# Patient Record
Sex: Female | Born: 1940 | Smoking: Never smoker
Health system: Southern US, Community
[De-identification: ages and names within clinical notes are randomized; demographics above are authoritative.]

## PROBLEM LIST (undated history)

## (undated) DIAGNOSIS — G459 Transient cerebral ischemic attack, unspecified: Secondary | ICD-10-CM

## (undated) DIAGNOSIS — I10 Essential (primary) hypertension: Secondary | ICD-10-CM

## (undated) DIAGNOSIS — J449 Chronic obstructive pulmonary disease, unspecified: Secondary | ICD-10-CM

## (undated) DIAGNOSIS — E119 Type 2 diabetes mellitus without complications: Secondary | ICD-10-CM

## (undated) DIAGNOSIS — F419 Anxiety disorder, unspecified: Secondary | ICD-10-CM

## (undated) DIAGNOSIS — E785 Hyperlipidemia, unspecified: Secondary | ICD-10-CM

## (undated) DIAGNOSIS — I639 Cerebral infarction, unspecified: Secondary | ICD-10-CM

## (undated) DIAGNOSIS — R0600 Dyspnea, unspecified: Secondary | ICD-10-CM

## (undated) HISTORY — PX: HERNIA REPAIR: SHX51

## (undated) HISTORY — DX: Cerebral infarction, unspecified: I63.9

## (undated) HISTORY — DX: Type 2 diabetes mellitus without complications: E11.9

## (undated) HISTORY — DX: Essential (primary) hypertension: I10

## (undated) HISTORY — PX: TOTAL ABDOMINAL HYSTERECTOMY: SHX209

## (undated) HISTORY — PX: CATARACT EXTRACTION: SUR2

## (undated) HISTORY — DX: Hyperlipidemia, unspecified: E78.5

## (undated) HISTORY — DX: Chronic obstructive pulmonary disease, unspecified: J44.9

## (undated) HISTORY — DX: Anxiety disorder, unspecified: F41.9

## (undated) HISTORY — DX: Transient cerebral ischemic attack, unspecified: G45.9

## (undated) HISTORY — DX: Dyspnea, unspecified: R06.00

---

## 1971-10-14 HISTORY — PX: TUBAL LIGATION: SHX77

## 1986-10-13 HISTORY — PX: VESICOVAGINAL FISTULA CLOSURE W/ TAH: SUR271

## 2011-11-20 DIAGNOSIS — N390 Urinary tract infection, site not specified: Secondary | ICD-10-CM | POA: Diagnosis not present

## 2011-12-18 DIAGNOSIS — N390 Urinary tract infection, site not specified: Secondary | ICD-10-CM | POA: Diagnosis not present

## 2011-12-18 DIAGNOSIS — N3289 Other specified disorders of bladder: Secondary | ICD-10-CM | POA: Diagnosis not present

## 2011-12-30 DIAGNOSIS — K219 Gastro-esophageal reflux disease without esophagitis: Secondary | ICD-10-CM | POA: Diagnosis not present

## 2011-12-30 DIAGNOSIS — F329 Major depressive disorder, single episode, unspecified: Secondary | ICD-10-CM | POA: Diagnosis not present

## 2011-12-30 DIAGNOSIS — E119 Type 2 diabetes mellitus without complications: Secondary | ICD-10-CM | POA: Diagnosis not present

## 2011-12-30 DIAGNOSIS — I10 Essential (primary) hypertension: Secondary | ICD-10-CM | POA: Diagnosis not present

## 2011-12-30 DIAGNOSIS — Z79899 Other long term (current) drug therapy: Secondary | ICD-10-CM | POA: Diagnosis not present

## 2011-12-30 DIAGNOSIS — E785 Hyperlipidemia, unspecified: Secondary | ICD-10-CM | POA: Diagnosis not present

## 2012-02-09 DIAGNOSIS — I10 Essential (primary) hypertension: Secondary | ICD-10-CM | POA: Diagnosis not present

## 2012-02-09 DIAGNOSIS — L723 Sebaceous cyst: Secondary | ICD-10-CM | POA: Diagnosis not present

## 2012-02-11 DIAGNOSIS — L02219 Cutaneous abscess of trunk, unspecified: Secondary | ICD-10-CM | POA: Diagnosis not present

## 2012-02-11 DIAGNOSIS — L723 Sebaceous cyst: Secondary | ICD-10-CM | POA: Diagnosis not present

## 2012-02-11 DIAGNOSIS — L03319 Cellulitis of trunk, unspecified: Secondary | ICD-10-CM | POA: Diagnosis not present

## 2012-02-12 DIAGNOSIS — T8189XA Other complications of procedures, not elsewhere classified, initial encounter: Secondary | ICD-10-CM | POA: Diagnosis not present

## 2012-03-31 DIAGNOSIS — L723 Sebaceous cyst: Secondary | ICD-10-CM | POA: Diagnosis not present

## 2012-04-07 DIAGNOSIS — K5900 Constipation, unspecified: Secondary | ICD-10-CM | POA: Diagnosis not present

## 2012-04-07 DIAGNOSIS — K602 Anal fissure, unspecified: Secondary | ICD-10-CM | POA: Diagnosis not present

## 2012-05-04 DIAGNOSIS — M1710 Unilateral primary osteoarthritis, unspecified knee: Secondary | ICD-10-CM | POA: Diagnosis not present

## 2012-05-04 DIAGNOSIS — M25569 Pain in unspecified knee: Secondary | ICD-10-CM | POA: Diagnosis not present

## 2012-05-11 DIAGNOSIS — Z79899 Other long term (current) drug therapy: Secondary | ICD-10-CM | POA: Diagnosis not present

## 2012-05-11 DIAGNOSIS — I10 Essential (primary) hypertension: Secondary | ICD-10-CM | POA: Diagnosis not present

## 2012-05-11 DIAGNOSIS — K219 Gastro-esophageal reflux disease without esophagitis: Secondary | ICD-10-CM | POA: Diagnosis not present

## 2012-05-11 DIAGNOSIS — E785 Hyperlipidemia, unspecified: Secondary | ICD-10-CM | POA: Diagnosis not present

## 2012-05-11 DIAGNOSIS — E119 Type 2 diabetes mellitus without complications: Secondary | ICD-10-CM | POA: Diagnosis not present

## 2012-06-25 DIAGNOSIS — N390 Urinary tract infection, site not specified: Secondary | ICD-10-CM | POA: Diagnosis not present

## 2012-06-25 DIAGNOSIS — F411 Generalized anxiety disorder: Secondary | ICD-10-CM | POA: Diagnosis not present

## 2012-07-05 DIAGNOSIS — N3289 Other specified disorders of bladder: Secondary | ICD-10-CM | POA: Diagnosis not present

## 2012-07-05 DIAGNOSIS — N390 Urinary tract infection, site not specified: Secondary | ICD-10-CM | POA: Diagnosis not present

## 2012-07-05 DIAGNOSIS — R300 Dysuria: Secondary | ICD-10-CM | POA: Diagnosis not present

## 2012-07-08 DIAGNOSIS — G5710 Meralgia paresthetica, unspecified lower limb: Secondary | ICD-10-CM | POA: Diagnosis not present

## 2012-07-08 DIAGNOSIS — M7150 Other bursitis, not elsewhere classified, unspecified site: Secondary | ICD-10-CM | POA: Diagnosis not present

## 2012-07-22 DIAGNOSIS — B029 Zoster without complications: Secondary | ICD-10-CM | POA: Diagnosis not present

## 2012-07-26 DIAGNOSIS — N318 Other neuromuscular dysfunction of bladder: Secondary | ICD-10-CM | POA: Diagnosis not present

## 2012-07-26 DIAGNOSIS — R300 Dysuria: Secondary | ICD-10-CM | POA: Diagnosis not present

## 2012-07-26 DIAGNOSIS — N390 Urinary tract infection, site not specified: Secondary | ICD-10-CM | POA: Diagnosis not present

## 2012-07-26 DIAGNOSIS — N952 Postmenopausal atrophic vaginitis: Secondary | ICD-10-CM | POA: Diagnosis not present

## 2012-08-25 DIAGNOSIS — E119 Type 2 diabetes mellitus without complications: Secondary | ICD-10-CM | POA: Diagnosis not present

## 2012-08-25 DIAGNOSIS — E785 Hyperlipidemia, unspecified: Secondary | ICD-10-CM | POA: Diagnosis not present

## 2012-08-25 DIAGNOSIS — Z79899 Other long term (current) drug therapy: Secondary | ICD-10-CM | POA: Diagnosis not present

## 2012-08-25 DIAGNOSIS — I10 Essential (primary) hypertension: Secondary | ICD-10-CM | POA: Diagnosis not present

## 2012-08-25 DIAGNOSIS — F329 Major depressive disorder, single episode, unspecified: Secondary | ICD-10-CM | POA: Diagnosis not present

## 2012-08-25 DIAGNOSIS — G4700 Insomnia, unspecified: Secondary | ICD-10-CM | POA: Diagnosis not present

## 2012-08-25 DIAGNOSIS — K219 Gastro-esophageal reflux disease without esophagitis: Secondary | ICD-10-CM | POA: Diagnosis not present

## 2013-02-10 DIAGNOSIS — I10 Essential (primary) hypertension: Secondary | ICD-10-CM | POA: Diagnosis not present

## 2013-02-10 DIAGNOSIS — K219 Gastro-esophageal reflux disease without esophagitis: Secondary | ICD-10-CM | POA: Diagnosis not present

## 2013-02-10 DIAGNOSIS — E119 Type 2 diabetes mellitus without complications: Secondary | ICD-10-CM | POA: Diagnosis not present

## 2013-02-10 DIAGNOSIS — G4700 Insomnia, unspecified: Secondary | ICD-10-CM | POA: Diagnosis not present

## 2013-02-28 DIAGNOSIS — I10 Essential (primary) hypertension: Secondary | ICD-10-CM | POA: Diagnosis not present

## 2013-02-28 DIAGNOSIS — E7800 Pure hypercholesterolemia, unspecified: Secondary | ICD-10-CM | POA: Diagnosis not present

## 2013-02-28 DIAGNOSIS — E119 Type 2 diabetes mellitus without complications: Secondary | ICD-10-CM | POA: Diagnosis not present

## 2013-03-10 DIAGNOSIS — E785 Hyperlipidemia, unspecified: Secondary | ICD-10-CM | POA: Diagnosis not present

## 2013-03-10 DIAGNOSIS — Z0000 Encounter for general adult medical examination without abnormal findings: Secondary | ICD-10-CM | POA: Diagnosis not present

## 2013-03-10 DIAGNOSIS — I10 Essential (primary) hypertension: Secondary | ICD-10-CM | POA: Diagnosis not present

## 2013-03-10 DIAGNOSIS — E876 Hypokalemia: Secondary | ICD-10-CM | POA: Diagnosis not present

## 2013-03-10 DIAGNOSIS — E119 Type 2 diabetes mellitus without complications: Secondary | ICD-10-CM | POA: Diagnosis not present

## 2013-03-10 DIAGNOSIS — K219 Gastro-esophageal reflux disease without esophagitis: Secondary | ICD-10-CM | POA: Diagnosis not present

## 2013-03-11 DIAGNOSIS — G4700 Insomnia, unspecified: Secondary | ICD-10-CM | POA: Diagnosis not present

## 2013-03-11 DIAGNOSIS — I10 Essential (primary) hypertension: Secondary | ICD-10-CM | POA: Diagnosis not present

## 2013-03-11 DIAGNOSIS — Z006 Encounter for examination for normal comparison and control in clinical research program: Secondary | ICD-10-CM | POA: Diagnosis not present

## 2013-03-11 DIAGNOSIS — F329 Major depressive disorder, single episode, unspecified: Secondary | ICD-10-CM | POA: Diagnosis not present

## 2013-04-25 DIAGNOSIS — N3000 Acute cystitis without hematuria: Secondary | ICD-10-CM | POA: Diagnosis not present

## 2013-06-20 DIAGNOSIS — I10 Essential (primary) hypertension: Secondary | ICD-10-CM | POA: Diagnosis not present

## 2013-06-20 DIAGNOSIS — R21 Rash and other nonspecific skin eruption: Secondary | ICD-10-CM | POA: Diagnosis not present

## 2013-06-20 DIAGNOSIS — G4700 Insomnia, unspecified: Secondary | ICD-10-CM | POA: Diagnosis not present

## 2013-06-20 DIAGNOSIS — E119 Type 2 diabetes mellitus without complications: Secondary | ICD-10-CM | POA: Diagnosis not present

## 2013-06-24 DIAGNOSIS — L738 Other specified follicular disorders: Secondary | ICD-10-CM | POA: Diagnosis not present

## 2013-06-24 DIAGNOSIS — L981 Factitial dermatitis: Secondary | ICD-10-CM | POA: Diagnosis not present

## 2013-06-24 DIAGNOSIS — L259 Unspecified contact dermatitis, unspecified cause: Secondary | ICD-10-CM | POA: Diagnosis not present

## 2013-09-16 DIAGNOSIS — E119 Type 2 diabetes mellitus without complications: Secondary | ICD-10-CM | POA: Diagnosis not present

## 2013-09-16 DIAGNOSIS — Z23 Encounter for immunization: Secondary | ICD-10-CM | POA: Diagnosis not present

## 2013-09-16 DIAGNOSIS — Z79899 Other long term (current) drug therapy: Secondary | ICD-10-CM | POA: Diagnosis not present

## 2013-09-16 DIAGNOSIS — I10 Essential (primary) hypertension: Secondary | ICD-10-CM | POA: Diagnosis not present

## 2013-09-16 DIAGNOSIS — E785 Hyperlipidemia, unspecified: Secondary | ICD-10-CM | POA: Diagnosis not present

## 2013-10-13 DIAGNOSIS — G459 Transient cerebral ischemic attack, unspecified: Secondary | ICD-10-CM

## 2013-10-13 DIAGNOSIS — I639 Cerebral infarction, unspecified: Secondary | ICD-10-CM

## 2013-10-13 HISTORY — DX: Transient cerebral ischemic attack, unspecified: G45.9

## 2013-10-13 HISTORY — DX: Cerebral infarction, unspecified: I63.9

## 2013-10-28 DIAGNOSIS — R0989 Other specified symptoms and signs involving the circulatory and respiratory systems: Secondary | ICD-10-CM | POA: Diagnosis not present

## 2013-10-28 DIAGNOSIS — N3090 Cystitis, unspecified without hematuria: Secondary | ICD-10-CM | POA: Diagnosis not present

## 2013-10-28 DIAGNOSIS — R0609 Other forms of dyspnea: Secondary | ICD-10-CM | POA: Diagnosis not present

## 2013-10-29 DIAGNOSIS — R209 Unspecified disturbances of skin sensation: Secondary | ICD-10-CM | POA: Diagnosis not present

## 2013-10-29 DIAGNOSIS — R0602 Shortness of breath: Secondary | ICD-10-CM | POA: Diagnosis not present

## 2013-10-29 DIAGNOSIS — S0990XA Unspecified injury of head, initial encounter: Secondary | ICD-10-CM | POA: Diagnosis not present

## 2013-10-29 DIAGNOSIS — J90 Pleural effusion, not elsewhere classified: Secondary | ICD-10-CM | POA: Diagnosis not present

## 2013-10-29 DIAGNOSIS — R072 Precordial pain: Secondary | ICD-10-CM | POA: Diagnosis not present

## 2013-10-29 DIAGNOSIS — I6789 Other cerebrovascular disease: Secondary | ICD-10-CM | POA: Diagnosis not present

## 2013-10-30 DIAGNOSIS — R52 Pain, unspecified: Secondary | ICD-10-CM | POA: Diagnosis present

## 2013-10-30 DIAGNOSIS — R5381 Other malaise: Secondary | ICD-10-CM | POA: Diagnosis present

## 2013-10-30 DIAGNOSIS — R0989 Other specified symptoms and signs involving the circulatory and respiratory systems: Secondary | ICD-10-CM | POA: Diagnosis present

## 2013-10-30 DIAGNOSIS — E860 Dehydration: Secondary | ICD-10-CM | POA: Diagnosis present

## 2013-10-30 DIAGNOSIS — G459 Transient cerebral ischemic attack, unspecified: Secondary | ICD-10-CM | POA: Diagnosis present

## 2013-10-30 DIAGNOSIS — I369 Nonrheumatic tricuspid valve disorder, unspecified: Secondary | ICD-10-CM | POA: Diagnosis not present

## 2013-10-30 DIAGNOSIS — E119 Type 2 diabetes mellitus without complications: Secondary | ICD-10-CM | POA: Diagnosis present

## 2013-10-30 DIAGNOSIS — J449 Chronic obstructive pulmonary disease, unspecified: Secondary | ICD-10-CM | POA: Diagnosis present

## 2013-10-30 DIAGNOSIS — E785 Hyperlipidemia, unspecified: Secondary | ICD-10-CM | POA: Diagnosis present

## 2013-10-30 DIAGNOSIS — R0789 Other chest pain: Secondary | ICD-10-CM | POA: Diagnosis not present

## 2013-10-30 DIAGNOSIS — Z79899 Other long term (current) drug therapy: Secondary | ICD-10-CM | POA: Diagnosis not present

## 2013-10-30 DIAGNOSIS — I359 Nonrheumatic aortic valve disorder, unspecified: Secondary | ICD-10-CM | POA: Diagnosis not present

## 2013-10-30 DIAGNOSIS — R079 Chest pain, unspecified: Secondary | ICD-10-CM | POA: Diagnosis not present

## 2013-10-30 DIAGNOSIS — I10 Essential (primary) hypertension: Secondary | ICD-10-CM | POA: Diagnosis present

## 2013-10-30 DIAGNOSIS — R29810 Facial weakness: Secondary | ICD-10-CM | POA: Diagnosis not present

## 2013-10-30 DIAGNOSIS — R0609 Other forms of dyspnea: Secondary | ICD-10-CM | POA: Diagnosis present

## 2013-10-30 DIAGNOSIS — E669 Obesity, unspecified: Secondary | ICD-10-CM | POA: Diagnosis present

## 2013-10-30 DIAGNOSIS — I658 Occlusion and stenosis of other precerebral arteries: Secondary | ICD-10-CM | POA: Diagnosis not present

## 2013-10-30 DIAGNOSIS — Z0389 Encounter for observation for other suspected diseases and conditions ruled out: Secondary | ICD-10-CM | POA: Diagnosis not present

## 2013-10-31 DIAGNOSIS — R29810 Facial weakness: Secondary | ICD-10-CM | POA: Diagnosis not present

## 2013-10-31 DIAGNOSIS — I359 Nonrheumatic aortic valve disorder, unspecified: Secondary | ICD-10-CM | POA: Diagnosis not present

## 2013-10-31 DIAGNOSIS — I369 Nonrheumatic tricuspid valve disorder, unspecified: Secondary | ICD-10-CM | POA: Diagnosis not present

## 2013-11-03 DIAGNOSIS — I10 Essential (primary) hypertension: Secondary | ICD-10-CM | POA: Diagnosis not present

## 2013-11-03 DIAGNOSIS — R0789 Other chest pain: Secondary | ICD-10-CM | POA: Diagnosis not present

## 2013-11-03 DIAGNOSIS — G459 Transient cerebral ischemic attack, unspecified: Secondary | ICD-10-CM | POA: Diagnosis not present

## 2013-11-03 DIAGNOSIS — R079 Chest pain, unspecified: Secondary | ICD-10-CM | POA: Diagnosis not present

## 2013-11-03 DIAGNOSIS — J449 Chronic obstructive pulmonary disease, unspecified: Secondary | ICD-10-CM | POA: Diagnosis not present

## 2013-11-03 DIAGNOSIS — E119 Type 2 diabetes mellitus without complications: Secondary | ICD-10-CM | POA: Diagnosis not present

## 2013-11-10 DIAGNOSIS — Z79899 Other long term (current) drug therapy: Secondary | ICD-10-CM | POA: Diagnosis not present

## 2013-11-10 DIAGNOSIS — F411 Generalized anxiety disorder: Secondary | ICD-10-CM | POA: Diagnosis not present

## 2013-11-10 DIAGNOSIS — J449 Chronic obstructive pulmonary disease, unspecified: Secondary | ICD-10-CM | POA: Diagnosis not present

## 2013-11-10 DIAGNOSIS — R5381 Other malaise: Secondary | ICD-10-CM | POA: Diagnosis not present

## 2013-11-10 DIAGNOSIS — R5383 Other fatigue: Secondary | ICD-10-CM | POA: Diagnosis not present

## 2013-11-10 DIAGNOSIS — I10 Essential (primary) hypertension: Secondary | ICD-10-CM | POA: Diagnosis not present

## 2013-11-11 ENCOUNTER — Institutional Professional Consult (permissible substitution): Payer: Self-pay | Admitting: Pulmonary Disease

## 2013-11-22 ENCOUNTER — Institutional Professional Consult (permissible substitution): Payer: Self-pay | Admitting: Critical Care Medicine

## 2013-11-28 ENCOUNTER — Institutional Professional Consult (permissible substitution): Payer: Self-pay | Admitting: Critical Care Medicine

## 2013-12-02 ENCOUNTER — Institutional Professional Consult (permissible substitution): Payer: Self-pay | Admitting: Critical Care Medicine

## 2013-12-09 DIAGNOSIS — E119 Type 2 diabetes mellitus without complications: Secondary | ICD-10-CM | POA: Diagnosis not present

## 2013-12-09 DIAGNOSIS — H5200 Hypermetropia, unspecified eye: Secondary | ICD-10-CM | POA: Diagnosis not present

## 2013-12-15 ENCOUNTER — Institutional Professional Consult (permissible substitution): Payer: Self-pay | Admitting: Critical Care Medicine

## 2013-12-16 DIAGNOSIS — F411 Generalized anxiety disorder: Secondary | ICD-10-CM | POA: Diagnosis not present

## 2013-12-16 DIAGNOSIS — E119 Type 2 diabetes mellitus without complications: Secondary | ICD-10-CM | POA: Diagnosis not present

## 2013-12-16 DIAGNOSIS — I10 Essential (primary) hypertension: Secondary | ICD-10-CM | POA: Diagnosis not present

## 2013-12-16 DIAGNOSIS — J449 Chronic obstructive pulmonary disease, unspecified: Secondary | ICD-10-CM | POA: Diagnosis not present

## 2013-12-21 ENCOUNTER — Encounter: Payer: Self-pay | Admitting: Critical Care Medicine

## 2013-12-22 ENCOUNTER — Institutional Professional Consult (permissible substitution): Payer: Self-pay | Admitting: Critical Care Medicine

## 2014-01-17 ENCOUNTER — Encounter: Payer: Self-pay | Admitting: *Deleted

## 2014-01-18 ENCOUNTER — Institutional Professional Consult (permissible substitution): Payer: Self-pay | Admitting: Critical Care Medicine

## 2014-04-11 DIAGNOSIS — L981 Factitial dermatitis: Secondary | ICD-10-CM | POA: Diagnosis not present

## 2014-04-11 DIAGNOSIS — L259 Unspecified contact dermatitis, unspecified cause: Secondary | ICD-10-CM | POA: Diagnosis not present

## 2014-04-12 DIAGNOSIS — Z23 Encounter for immunization: Secondary | ICD-10-CM | POA: Diagnosis not present

## 2014-04-19 DIAGNOSIS — I10 Essential (primary) hypertension: Secondary | ICD-10-CM | POA: Diagnosis not present

## 2014-04-19 DIAGNOSIS — E782 Mixed hyperlipidemia: Secondary | ICD-10-CM | POA: Diagnosis not present

## 2014-04-19 DIAGNOSIS — K219 Gastro-esophageal reflux disease without esophagitis: Secondary | ICD-10-CM | POA: Diagnosis not present

## 2014-04-19 DIAGNOSIS — E119 Type 2 diabetes mellitus without complications: Secondary | ICD-10-CM | POA: Diagnosis not present

## 2014-04-26 DIAGNOSIS — Z1231 Encounter for screening mammogram for malignant neoplasm of breast: Secondary | ICD-10-CM | POA: Diagnosis not present

## 2014-04-26 DIAGNOSIS — E782 Mixed hyperlipidemia: Secondary | ICD-10-CM | POA: Diagnosis not present

## 2014-04-26 DIAGNOSIS — I10 Essential (primary) hypertension: Secondary | ICD-10-CM | POA: Diagnosis not present

## 2014-04-26 DIAGNOSIS — E119 Type 2 diabetes mellitus without complications: Secondary | ICD-10-CM | POA: Diagnosis not present

## 2014-04-30 DIAGNOSIS — J209 Acute bronchitis, unspecified: Secondary | ICD-10-CM | POA: Diagnosis not present

## 2014-04-30 DIAGNOSIS — E119 Type 2 diabetes mellitus without complications: Secondary | ICD-10-CM | POA: Diagnosis not present

## 2014-04-30 DIAGNOSIS — I10 Essential (primary) hypertension: Secondary | ICD-10-CM | POA: Diagnosis not present

## 2014-04-30 DIAGNOSIS — Z7982 Long term (current) use of aspirin: Secondary | ICD-10-CM | POA: Diagnosis not present

## 2014-04-30 DIAGNOSIS — Z8673 Personal history of transient ischemic attack (TIA), and cerebral infarction without residual deficits: Secondary | ICD-10-CM | POA: Diagnosis not present

## 2014-04-30 DIAGNOSIS — F329 Major depressive disorder, single episode, unspecified: Secondary | ICD-10-CM | POA: Diagnosis not present

## 2014-04-30 DIAGNOSIS — M129 Arthropathy, unspecified: Secondary | ICD-10-CM | POA: Diagnosis not present

## 2014-04-30 DIAGNOSIS — F3289 Other specified depressive episodes: Secondary | ICD-10-CM | POA: Diagnosis not present

## 2014-04-30 DIAGNOSIS — E7800 Pure hypercholesterolemia, unspecified: Secondary | ICD-10-CM | POA: Diagnosis not present

## 2014-04-30 DIAGNOSIS — R0602 Shortness of breath: Secondary | ICD-10-CM | POA: Diagnosis not present

## 2014-04-30 DIAGNOSIS — Z79899 Other long term (current) drug therapy: Secondary | ICD-10-CM | POA: Diagnosis not present

## 2014-04-30 DIAGNOSIS — J440 Chronic obstructive pulmonary disease with acute lower respiratory infection: Secondary | ICD-10-CM | POA: Diagnosis not present

## 2014-05-08 DIAGNOSIS — Z6837 Body mass index (BMI) 37.0-37.9, adult: Secondary | ICD-10-CM | POA: Diagnosis not present

## 2014-05-08 DIAGNOSIS — M549 Dorsalgia, unspecified: Secondary | ICD-10-CM | POA: Diagnosis not present

## 2014-05-20 DIAGNOSIS — M5430 Sciatica, unspecified side: Secondary | ICD-10-CM | POA: Diagnosis not present

## 2014-05-20 DIAGNOSIS — M79609 Pain in unspecified limb: Secondary | ICD-10-CM | POA: Diagnosis not present

## 2014-05-20 DIAGNOSIS — N390 Urinary tract infection, site not specified: Secondary | ICD-10-CM | POA: Diagnosis not present

## 2014-05-20 DIAGNOSIS — I10 Essential (primary) hypertension: Secondary | ICD-10-CM | POA: Diagnosis not present

## 2014-05-20 DIAGNOSIS — E7800 Pure hypercholesterolemia, unspecified: Secondary | ICD-10-CM | POA: Diagnosis not present

## 2014-05-20 DIAGNOSIS — J449 Chronic obstructive pulmonary disease, unspecified: Secondary | ICD-10-CM | POA: Diagnosis not present

## 2014-05-20 DIAGNOSIS — R0989 Other specified symptoms and signs involving the circulatory and respiratory systems: Secondary | ICD-10-CM | POA: Diagnosis not present

## 2014-05-20 DIAGNOSIS — Z79899 Other long term (current) drug therapy: Secondary | ICD-10-CM | POA: Diagnosis not present

## 2014-05-20 DIAGNOSIS — E119 Type 2 diabetes mellitus without complications: Secondary | ICD-10-CM | POA: Diagnosis not present

## 2014-05-24 DIAGNOSIS — IMO0002 Reserved for concepts with insufficient information to code with codable children: Secondary | ICD-10-CM | POA: Diagnosis not present

## 2014-05-24 DIAGNOSIS — M549 Dorsalgia, unspecified: Secondary | ICD-10-CM | POA: Diagnosis not present

## 2014-05-24 DIAGNOSIS — Z6837 Body mass index (BMI) 37.0-37.9, adult: Secondary | ICD-10-CM | POA: Diagnosis not present

## 2014-05-24 DIAGNOSIS — M47817 Spondylosis without myelopathy or radiculopathy, lumbosacral region: Secondary | ICD-10-CM | POA: Diagnosis not present

## 2014-05-29 DIAGNOSIS — M1710 Unilateral primary osteoarthritis, unspecified knee: Secondary | ICD-10-CM | POA: Diagnosis not present

## 2014-05-31 DIAGNOSIS — IMO0001 Reserved for inherently not codable concepts without codable children: Secondary | ICD-10-CM | POA: Diagnosis not present

## 2014-05-31 DIAGNOSIS — E119 Type 2 diabetes mellitus without complications: Secondary | ICD-10-CM | POA: Diagnosis not present

## 2014-05-31 DIAGNOSIS — M545 Low back pain, unspecified: Secondary | ICD-10-CM | POA: Diagnosis not present

## 2014-05-31 DIAGNOSIS — R269 Unspecified abnormalities of gait and mobility: Secondary | ICD-10-CM | POA: Diagnosis not present

## 2014-06-02 DIAGNOSIS — M545 Low back pain, unspecified: Secondary | ICD-10-CM | POA: Diagnosis not present

## 2014-06-02 DIAGNOSIS — E119 Type 2 diabetes mellitus without complications: Secondary | ICD-10-CM | POA: Diagnosis not present

## 2014-06-02 DIAGNOSIS — R269 Unspecified abnormalities of gait and mobility: Secondary | ICD-10-CM | POA: Diagnosis not present

## 2014-06-02 DIAGNOSIS — IMO0001 Reserved for inherently not codable concepts without codable children: Secondary | ICD-10-CM | POA: Diagnosis not present

## 2014-06-05 DIAGNOSIS — E119 Type 2 diabetes mellitus without complications: Secondary | ICD-10-CM | POA: Diagnosis not present

## 2014-06-05 DIAGNOSIS — IMO0001 Reserved for inherently not codable concepts without codable children: Secondary | ICD-10-CM | POA: Diagnosis not present

## 2014-06-05 DIAGNOSIS — R269 Unspecified abnormalities of gait and mobility: Secondary | ICD-10-CM | POA: Diagnosis not present

## 2014-06-05 DIAGNOSIS — M545 Low back pain, unspecified: Secondary | ICD-10-CM | POA: Diagnosis not present

## 2014-06-06 DIAGNOSIS — M48061 Spinal stenosis, lumbar region without neurogenic claudication: Secondary | ICD-10-CM | POA: Diagnosis not present

## 2014-06-07 DIAGNOSIS — E119 Type 2 diabetes mellitus without complications: Secondary | ICD-10-CM | POA: Diagnosis not present

## 2014-06-07 DIAGNOSIS — M545 Low back pain, unspecified: Secondary | ICD-10-CM | POA: Diagnosis not present

## 2014-06-07 DIAGNOSIS — IMO0001 Reserved for inherently not codable concepts without codable children: Secondary | ICD-10-CM | POA: Diagnosis not present

## 2014-06-07 DIAGNOSIS — R269 Unspecified abnormalities of gait and mobility: Secondary | ICD-10-CM | POA: Diagnosis not present

## 2014-08-30 DIAGNOSIS — E782 Mixed hyperlipidemia: Secondary | ICD-10-CM | POA: Diagnosis not present

## 2014-08-30 DIAGNOSIS — I10 Essential (primary) hypertension: Secondary | ICD-10-CM | POA: Diagnosis not present

## 2014-08-30 DIAGNOSIS — E119 Type 2 diabetes mellitus without complications: Secondary | ICD-10-CM | POA: Diagnosis not present

## 2014-09-06 DIAGNOSIS — J449 Chronic obstructive pulmonary disease, unspecified: Secondary | ICD-10-CM | POA: Diagnosis not present

## 2014-09-06 DIAGNOSIS — I10 Essential (primary) hypertension: Secondary | ICD-10-CM | POA: Diagnosis not present

## 2014-09-06 DIAGNOSIS — E782 Mixed hyperlipidemia: Secondary | ICD-10-CM | POA: Diagnosis not present

## 2014-09-06 DIAGNOSIS — E119 Type 2 diabetes mellitus without complications: Secondary | ICD-10-CM | POA: Diagnosis not present

## 2014-10-22 DIAGNOSIS — I10 Essential (primary) hypertension: Secondary | ICD-10-CM | POA: Diagnosis not present

## 2014-10-22 DIAGNOSIS — E780 Pure hypercholesterolemia: Secondary | ICD-10-CM | POA: Diagnosis not present

## 2014-10-22 DIAGNOSIS — F329 Major depressive disorder, single episode, unspecified: Secondary | ICD-10-CM | POA: Diagnosis not present

## 2014-10-22 DIAGNOSIS — J209 Acute bronchitis, unspecified: Secondary | ICD-10-CM | POA: Diagnosis not present

## 2014-10-22 DIAGNOSIS — M1990 Unspecified osteoarthritis, unspecified site: Secondary | ICD-10-CM | POA: Diagnosis not present

## 2014-10-22 DIAGNOSIS — E119 Type 2 diabetes mellitus without complications: Secondary | ICD-10-CM | POA: Diagnosis not present

## 2014-10-22 DIAGNOSIS — R05 Cough: Secondary | ICD-10-CM | POA: Diagnosis not present

## 2014-10-22 DIAGNOSIS — Z792 Long term (current) use of antibiotics: Secondary | ICD-10-CM | POA: Diagnosis not present

## 2014-10-22 DIAGNOSIS — Z7982 Long term (current) use of aspirin: Secondary | ICD-10-CM | POA: Diagnosis not present

## 2014-10-27 DIAGNOSIS — Z792 Long term (current) use of antibiotics: Secondary | ICD-10-CM | POA: Diagnosis not present

## 2014-10-27 DIAGNOSIS — J449 Chronic obstructive pulmonary disease, unspecified: Secondary | ICD-10-CM | POA: Diagnosis not present

## 2014-10-27 DIAGNOSIS — J069 Acute upper respiratory infection, unspecified: Secondary | ICD-10-CM | POA: Diagnosis not present

## 2014-10-27 DIAGNOSIS — R05 Cough: Secondary | ICD-10-CM | POA: Diagnosis not present

## 2014-10-27 DIAGNOSIS — Z7982 Long term (current) use of aspirin: Secondary | ICD-10-CM | POA: Diagnosis not present

## 2014-10-27 DIAGNOSIS — R0602 Shortness of breath: Secondary | ICD-10-CM | POA: Diagnosis not present

## 2014-10-27 DIAGNOSIS — I10 Essential (primary) hypertension: Secondary | ICD-10-CM | POA: Diagnosis not present

## 2014-10-28 DIAGNOSIS — R069 Unspecified abnormalities of breathing: Secondary | ICD-10-CM | POA: Diagnosis not present

## 2014-10-28 DIAGNOSIS — R0602 Shortness of breath: Secondary | ICD-10-CM | POA: Diagnosis not present

## 2014-10-28 DIAGNOSIS — E785 Hyperlipidemia, unspecified: Secondary | ICD-10-CM | POA: Diagnosis not present

## 2014-10-28 DIAGNOSIS — K8080 Other cholelithiasis without obstruction: Secondary | ICD-10-CM | POA: Diagnosis not present

## 2014-10-28 DIAGNOSIS — Z79899 Other long term (current) drug therapy: Secondary | ICD-10-CM | POA: Diagnosis not present

## 2014-10-28 DIAGNOSIS — E119 Type 2 diabetes mellitus without complications: Secondary | ICD-10-CM | POA: Diagnosis not present

## 2014-10-28 DIAGNOSIS — J069 Acute upper respiratory infection, unspecified: Secondary | ICD-10-CM | POA: Diagnosis not present

## 2014-10-28 DIAGNOSIS — Z6839 Body mass index (BMI) 39.0-39.9, adult: Secondary | ICD-10-CM | POA: Diagnosis not present

## 2014-10-28 DIAGNOSIS — R079 Chest pain, unspecified: Secondary | ICD-10-CM | POA: Diagnosis not present

## 2014-10-28 DIAGNOSIS — R0609 Other forms of dyspnea: Secondary | ICD-10-CM | POA: Diagnosis not present

## 2014-10-28 DIAGNOSIS — Z7982 Long term (current) use of aspirin: Secondary | ICD-10-CM | POA: Diagnosis not present

## 2014-10-28 DIAGNOSIS — I2510 Atherosclerotic heart disease of native coronary artery without angina pectoris: Secondary | ICD-10-CM | POA: Diagnosis not present

## 2014-10-28 DIAGNOSIS — R05 Cough: Secondary | ICD-10-CM | POA: Diagnosis not present

## 2014-10-28 DIAGNOSIS — J449 Chronic obstructive pulmonary disease, unspecified: Secondary | ICD-10-CM | POA: Diagnosis not present

## 2014-10-28 DIAGNOSIS — R0600 Dyspnea, unspecified: Secondary | ICD-10-CM | POA: Diagnosis not present

## 2014-10-28 DIAGNOSIS — I10 Essential (primary) hypertension: Secondary | ICD-10-CM | POA: Diagnosis not present

## 2014-10-28 DIAGNOSIS — R072 Precordial pain: Secondary | ICD-10-CM | POA: Diagnosis not present

## 2014-10-28 DIAGNOSIS — Z8673 Personal history of transient ischemic attack (TIA), and cerebral infarction without residual deficits: Secondary | ICD-10-CM | POA: Diagnosis not present

## 2014-10-28 DIAGNOSIS — Z7952 Long term (current) use of systemic steroids: Secondary | ICD-10-CM | POA: Diagnosis not present

## 2014-10-29 DIAGNOSIS — R0789 Other chest pain: Secondary | ICD-10-CM | POA: Diagnosis not present

## 2014-10-29 DIAGNOSIS — K8020 Calculus of gallbladder without cholecystitis without obstruction: Secondary | ICD-10-CM | POA: Diagnosis not present

## 2014-11-07 ENCOUNTER — Telehealth: Payer: Self-pay | Admitting: Critical Care Medicine

## 2014-11-07 NOTE — Telephone Encounter (Signed)
Spoke with Delia at Dr. Meta Hatchet office, was advised that pt already had consult scheduled.  Nothing further needed.

## 2014-11-08 DIAGNOSIS — C44311 Basal cell carcinoma of skin of nose: Secondary | ICD-10-CM | POA: Diagnosis not present

## 2014-11-29 DIAGNOSIS — C44311 Basal cell carcinoma of skin of nose: Secondary | ICD-10-CM | POA: Diagnosis not present

## 2014-12-04 DIAGNOSIS — M4800 Spinal stenosis, site unspecified: Secondary | ICD-10-CM | POA: Diagnosis not present

## 2014-12-04 DIAGNOSIS — Z0000 Encounter for general adult medical examination without abnormal findings: Secondary | ICD-10-CM | POA: Diagnosis not present

## 2014-12-04 DIAGNOSIS — F329 Major depressive disorder, single episode, unspecified: Secondary | ICD-10-CM | POA: Diagnosis not present

## 2014-12-04 DIAGNOSIS — M170 Bilateral primary osteoarthritis of knee: Secondary | ICD-10-CM | POA: Diagnosis not present

## 2014-12-14 ENCOUNTER — Institutional Professional Consult (permissible substitution): Payer: Self-pay | Admitting: Pulmonary Disease

## 2014-12-26 DIAGNOSIS — E119 Type 2 diabetes mellitus without complications: Secondary | ICD-10-CM | POA: Diagnosis not present

## 2014-12-27 DIAGNOSIS — E782 Mixed hyperlipidemia: Secondary | ICD-10-CM | POA: Diagnosis not present

## 2014-12-27 DIAGNOSIS — I10 Essential (primary) hypertension: Secondary | ICD-10-CM | POA: Diagnosis not present

## 2014-12-27 DIAGNOSIS — E119 Type 2 diabetes mellitus without complications: Secondary | ICD-10-CM | POA: Diagnosis not present

## 2015-01-04 DIAGNOSIS — L820 Inflamed seborrheic keratosis: Secondary | ICD-10-CM | POA: Diagnosis not present

## 2015-01-08 DIAGNOSIS — I129 Hypertensive chronic kidney disease with stage 1 through stage 4 chronic kidney disease, or unspecified chronic kidney disease: Secondary | ICD-10-CM | POA: Diagnosis not present

## 2015-01-08 DIAGNOSIS — R350 Frequency of micturition: Secondary | ICD-10-CM | POA: Diagnosis not present

## 2015-01-08 DIAGNOSIS — N182 Chronic kidney disease, stage 2 (mild): Secondary | ICD-10-CM | POA: Diagnosis not present

## 2015-01-08 DIAGNOSIS — E1122 Type 2 diabetes mellitus with diabetic chronic kidney disease: Secondary | ICD-10-CM | POA: Diagnosis not present

## 2015-01-08 DIAGNOSIS — E119 Type 2 diabetes mellitus without complications: Secondary | ICD-10-CM | POA: Diagnosis not present

## 2015-01-12 DIAGNOSIS — E1122 Type 2 diabetes mellitus with diabetic chronic kidney disease: Secondary | ICD-10-CM | POA: Diagnosis not present

## 2015-01-29 DIAGNOSIS — R05 Cough: Secondary | ICD-10-CM | POA: Diagnosis not present

## 2015-01-29 DIAGNOSIS — Z6837 Body mass index (BMI) 37.0-37.9, adult: Secondary | ICD-10-CM | POA: Diagnosis not present

## 2015-01-29 DIAGNOSIS — J029 Acute pharyngitis, unspecified: Secondary | ICD-10-CM | POA: Diagnosis not present

## 2015-03-08 DIAGNOSIS — C44311 Basal cell carcinoma of skin of nose: Secondary | ICD-10-CM | POA: Diagnosis not present

## 2015-04-03 DIAGNOSIS — E119 Type 2 diabetes mellitus without complications: Secondary | ICD-10-CM | POA: Diagnosis not present

## 2015-04-03 DIAGNOSIS — E1122 Type 2 diabetes mellitus with diabetic chronic kidney disease: Secondary | ICD-10-CM | POA: Diagnosis not present

## 2015-04-03 DIAGNOSIS — E1169 Type 2 diabetes mellitus with other specified complication: Secondary | ICD-10-CM | POA: Diagnosis not present

## 2015-04-09 DIAGNOSIS — E1122 Type 2 diabetes mellitus with diabetic chronic kidney disease: Secondary | ICD-10-CM | POA: Diagnosis not present

## 2015-04-09 DIAGNOSIS — E1169 Type 2 diabetes mellitus with other specified complication: Secondary | ICD-10-CM | POA: Diagnosis not present

## 2015-04-09 DIAGNOSIS — I129 Hypertensive chronic kidney disease with stage 1 through stage 4 chronic kidney disease, or unspecified chronic kidney disease: Secondary | ICD-10-CM | POA: Diagnosis not present

## 2015-04-09 DIAGNOSIS — E785 Hyperlipidemia, unspecified: Secondary | ICD-10-CM | POA: Diagnosis not present

## 2015-05-09 DIAGNOSIS — Z6837 Body mass index (BMI) 37.0-37.9, adult: Secondary | ICD-10-CM | POA: Diagnosis not present

## 2015-05-09 DIAGNOSIS — M4800 Spinal stenosis, site unspecified: Secondary | ICD-10-CM | POA: Diagnosis not present

## 2015-05-09 DIAGNOSIS — G4700 Insomnia, unspecified: Secondary | ICD-10-CM | POA: Diagnosis not present

## 2015-05-09 DIAGNOSIS — M170 Bilateral primary osteoarthritis of knee: Secondary | ICD-10-CM | POA: Diagnosis not present

## 2015-05-13 DIAGNOSIS — Z79899 Other long term (current) drug therapy: Secondary | ICD-10-CM | POA: Diagnosis not present

## 2015-05-13 DIAGNOSIS — I2510 Atherosclerotic heart disease of native coronary artery without angina pectoris: Secondary | ICD-10-CM | POA: Diagnosis not present

## 2015-05-13 DIAGNOSIS — I10 Essential (primary) hypertension: Secondary | ICD-10-CM | POA: Diagnosis not present

## 2015-05-13 DIAGNOSIS — I313 Pericardial effusion (noninflammatory): Secondary | ICD-10-CM | POA: Diagnosis not present

## 2015-05-13 DIAGNOSIS — E119 Type 2 diabetes mellitus without complications: Secondary | ICD-10-CM | POA: Diagnosis not present

## 2015-05-13 DIAGNOSIS — Z8673 Personal history of transient ischemic attack (TIA), and cerebral infarction without residual deficits: Secondary | ICD-10-CM | POA: Diagnosis not present

## 2015-05-13 DIAGNOSIS — E780 Pure hypercholesterolemia: Secondary | ICD-10-CM | POA: Diagnosis not present

## 2015-05-13 DIAGNOSIS — F329 Major depressive disorder, single episode, unspecified: Secondary | ICD-10-CM | POA: Diagnosis not present

## 2015-05-13 DIAGNOSIS — Z7982 Long term (current) use of aspirin: Secondary | ICD-10-CM | POA: Diagnosis not present

## 2015-05-13 DIAGNOSIS — R0602 Shortness of breath: Secondary | ICD-10-CM | POA: Diagnosis not present

## 2015-05-13 DIAGNOSIS — I700 Atherosclerosis of aorta: Secondary | ICD-10-CM | POA: Diagnosis not present

## 2015-05-13 DIAGNOSIS — J441 Chronic obstructive pulmonary disease with (acute) exacerbation: Secondary | ICD-10-CM | POA: Diagnosis not present

## 2015-07-30 DIAGNOSIS — M170 Bilateral primary osteoarthritis of knee: Secondary | ICD-10-CM | POA: Diagnosis not present

## 2015-08-21 DIAGNOSIS — E1169 Type 2 diabetes mellitus with other specified complication: Secondary | ICD-10-CM | POA: Diagnosis not present

## 2015-08-21 DIAGNOSIS — E119 Type 2 diabetes mellitus without complications: Secondary | ICD-10-CM | POA: Diagnosis not present

## 2015-08-21 DIAGNOSIS — E1122 Type 2 diabetes mellitus with diabetic chronic kidney disease: Secondary | ICD-10-CM | POA: Diagnosis not present

## 2015-08-21 DIAGNOSIS — Z23 Encounter for immunization: Secondary | ICD-10-CM | POA: Diagnosis not present

## 2015-08-30 DIAGNOSIS — J449 Chronic obstructive pulmonary disease, unspecified: Secondary | ICD-10-CM | POA: Diagnosis not present

## 2015-08-30 DIAGNOSIS — M179 Osteoarthritis of knee, unspecified: Secondary | ICD-10-CM | POA: Diagnosis not present

## 2015-08-30 DIAGNOSIS — Z6837 Body mass index (BMI) 37.0-37.9, adult: Secondary | ICD-10-CM | POA: Diagnosis not present

## 2015-09-18 DIAGNOSIS — Z6838 Body mass index (BMI) 38.0-38.9, adult: Secondary | ICD-10-CM | POA: Diagnosis not present

## 2015-09-18 DIAGNOSIS — J441 Chronic obstructive pulmonary disease with (acute) exacerbation: Secondary | ICD-10-CM | POA: Diagnosis not present

## 2015-11-14 DIAGNOSIS — E119 Type 2 diabetes mellitus without complications: Secondary | ICD-10-CM | POA: Diagnosis not present

## 2015-11-14 DIAGNOSIS — E1169 Type 2 diabetes mellitus with other specified complication: Secondary | ICD-10-CM | POA: Diagnosis not present

## 2015-11-14 DIAGNOSIS — E1122 Type 2 diabetes mellitus with diabetic chronic kidney disease: Secondary | ICD-10-CM | POA: Diagnosis not present

## 2015-11-26 DIAGNOSIS — I129 Hypertensive chronic kidney disease with stage 1 through stage 4 chronic kidney disease, or unspecified chronic kidney disease: Secondary | ICD-10-CM | POA: Diagnosis not present

## 2015-11-26 DIAGNOSIS — E1122 Type 2 diabetes mellitus with diabetic chronic kidney disease: Secondary | ICD-10-CM | POA: Diagnosis not present

## 2015-11-26 DIAGNOSIS — Z6837 Body mass index (BMI) 37.0-37.9, adult: Secondary | ICD-10-CM | POA: Diagnosis not present

## 2015-11-26 DIAGNOSIS — E1169 Type 2 diabetes mellitus with other specified complication: Secondary | ICD-10-CM | POA: Diagnosis not present

## 2015-11-26 DIAGNOSIS — E785 Hyperlipidemia, unspecified: Secondary | ICD-10-CM | POA: Diagnosis not present

## 2015-12-13 DIAGNOSIS — M25561 Pain in right knee: Secondary | ICD-10-CM | POA: Diagnosis not present

## 2015-12-13 DIAGNOSIS — M170 Bilateral primary osteoarthritis of knee: Secondary | ICD-10-CM | POA: Diagnosis not present

## 2015-12-13 DIAGNOSIS — M25562 Pain in left knee: Secondary | ICD-10-CM | POA: Diagnosis not present

## 2016-02-13 DIAGNOSIS — M5416 Radiculopathy, lumbar region: Secondary | ICD-10-CM | POA: Diagnosis not present

## 2016-02-13 DIAGNOSIS — M4316 Spondylolisthesis, lumbar region: Secondary | ICD-10-CM | POA: Diagnosis not present

## 2016-02-13 DIAGNOSIS — M545 Low back pain: Secondary | ICD-10-CM | POA: Diagnosis not present

## 2016-02-13 DIAGNOSIS — M549 Dorsalgia, unspecified: Secondary | ICD-10-CM | POA: Diagnosis not present

## 2016-02-15 ENCOUNTER — Other Ambulatory Visit: Payer: Self-pay | Admitting: Orthopaedic Surgery

## 2016-02-15 DIAGNOSIS — M545 Low back pain: Secondary | ICD-10-CM

## 2016-02-15 DIAGNOSIS — J189 Pneumonia, unspecified organism: Secondary | ICD-10-CM | POA: Diagnosis not present

## 2016-02-25 ENCOUNTER — Other Ambulatory Visit: Payer: Self-pay

## 2016-03-01 ENCOUNTER — Other Ambulatory Visit: Payer: Self-pay

## 2016-03-03 ENCOUNTER — Inpatient Hospital Stay: Admission: RE | Admit: 2016-03-03 | Payer: Self-pay | Source: Ambulatory Visit

## 2016-03-05 ENCOUNTER — Inpatient Hospital Stay: Admission: RE | Admit: 2016-03-05 | Payer: Self-pay | Source: Ambulatory Visit

## 2016-03-13 ENCOUNTER — Other Ambulatory Visit: Payer: Self-pay

## 2016-03-20 ENCOUNTER — Ambulatory Visit
Admission: RE | Admit: 2016-03-20 | Discharge: 2016-03-20 | Disposition: A | Discharge status: Home / Self Care | Payer: Medicare Other | Source: Ambulatory Visit | Attending: Orthopaedic Surgery | Admitting: Orthopaedic Surgery

## 2016-03-20 DIAGNOSIS — M4806 Spinal stenosis, lumbar region: Secondary | ICD-10-CM | POA: Diagnosis not present

## 2016-03-20 DIAGNOSIS — M545 Low back pain: Secondary | ICD-10-CM

## 2016-03-20 DIAGNOSIS — E1122 Type 2 diabetes mellitus with diabetic chronic kidney disease: Secondary | ICD-10-CM | POA: Diagnosis not present

## 2016-03-20 DIAGNOSIS — E785 Hyperlipidemia, unspecified: Secondary | ICD-10-CM | POA: Diagnosis not present

## 2016-03-20 DIAGNOSIS — E1169 Type 2 diabetes mellitus with other specified complication: Secondary | ICD-10-CM | POA: Diagnosis not present

## 2016-03-21 ENCOUNTER — Institutional Professional Consult (permissible substitution): Payer: Medicare Other | Admitting: Pulmonary Disease

## 2016-03-25 DIAGNOSIS — M545 Low back pain: Secondary | ICD-10-CM | POA: Diagnosis not present

## 2016-03-25 DIAGNOSIS — M5416 Radiculopathy, lumbar region: Secondary | ICD-10-CM | POA: Diagnosis not present

## 2016-03-25 DIAGNOSIS — M4316 Spondylolisthesis, lumbar region: Secondary | ICD-10-CM | POA: Diagnosis not present

## 2016-04-01 DIAGNOSIS — I129 Hypertensive chronic kidney disease with stage 1 through stage 4 chronic kidney disease, or unspecified chronic kidney disease: Secondary | ICD-10-CM | POA: Diagnosis not present

## 2016-04-01 DIAGNOSIS — E119 Type 2 diabetes mellitus without complications: Secondary | ICD-10-CM | POA: Diagnosis not present

## 2016-04-01 DIAGNOSIS — N182 Chronic kidney disease, stage 2 (mild): Secondary | ICD-10-CM | POA: Diagnosis not present

## 2016-04-01 DIAGNOSIS — E1122 Type 2 diabetes mellitus with diabetic chronic kidney disease: Secondary | ICD-10-CM | POA: Diagnosis not present

## 2016-04-22 DIAGNOSIS — Z0000 Encounter for general adult medical examination without abnormal findings: Secondary | ICD-10-CM | POA: Diagnosis not present

## 2016-04-22 DIAGNOSIS — Z139 Encounter for screening, unspecified: Secondary | ICD-10-CM | POA: Diagnosis not present

## 2016-04-22 DIAGNOSIS — Z9181 History of falling: Secondary | ICD-10-CM | POA: Diagnosis not present

## 2016-04-24 DIAGNOSIS — M170 Bilateral primary osteoarthritis of knee: Secondary | ICD-10-CM | POA: Diagnosis not present

## 2016-04-29 ENCOUNTER — Ambulatory Visit (INDEPENDENT_AMBULATORY_CARE_PROVIDER_SITE_OTHER): Payer: Medicare Other | Admitting: Internal Medicine

## 2016-04-29 ENCOUNTER — Encounter: Payer: Self-pay | Admitting: Internal Medicine

## 2016-04-29 VITALS — BP 134/70 | HR 66 | Ht 65.5 in | Wt 224.6 lb

## 2016-04-29 DIAGNOSIS — I10 Essential (primary) hypertension: Secondary | ICD-10-CM

## 2016-04-29 DIAGNOSIS — R0600 Dyspnea, unspecified: Secondary | ICD-10-CM | POA: Diagnosis not present

## 2016-04-29 DIAGNOSIS — J4530 Mild persistent asthma, uncomplicated: Secondary | ICD-10-CM | POA: Diagnosis not present

## 2016-04-29 LAB — NITRIC OXIDE: NITRIC OXIDE: 10

## 2016-04-29 MED ORDER — VALSARTAN 160 MG PO TABS: 160.0000 mg | ORAL_TABLET | Freq: Every day | ORAL | Status: AC

## 2016-04-29 MED ORDER — FAMOTIDINE 20 MG PO TABS: ORAL_TABLET | ORAL | Status: AC

## 2016-04-29 NOTE — Patient Instructions (Addendum)
Stop incruse and lisinopril and cozar   Valsartan 160 mg daily   Only use your albuterol as a rescue medication to be used if you can't catch your breath by resting or doing a relaxed purse lip breathing pattern.  - The less you use it, the better it will work when you need it. - Ok to use up to 2 puffs  every 4 hours if you must but call for immediate appointment if use goes up over your usual need - Don't leave home without it !!  (think of it like the spare tire for your car)   Continue prilosec Take 30-60 min before first meal of the day and add pepcid 20 mg at bedtime until you return  Please schedule a follow up office visit in 6 weeks, call sooner if needed with pfts on return ok to push back

## 2016-04-29 NOTE — Progress Notes (Signed)
Subjective:     Patient ID: Candice Allen, female   DOB: November 21, 1940,     MRN: [id number]  HPI  87 yowf never smoker with onset of asthma in late 20s with allergy shots x 4 years and total resolution of "wheezing" and no need for any medication then around 2013 noted onset of doe when climbing new steps with dx of copd in Jan 2015 while admitted for cva so placed on inhalers but did not help and worse to point where sob just trying to recline back in recliner so self referred to pulmonary clinic    04/29/2016 1st [facility] office visit/ Merisa Julio  Unexplained doe on coreg and ACEi and incruse  Chief Complaint  Patient presents with  . Pulmonary Consult    Self referral for COPD. Pt states that she was dxed with COPD 2 yrs ago. She c/o non prod cough with DOE "walking not far".    indolent onset doe x mailbox and back but knees gives out first  Variably severe cough rx as bronchitis better p prednisone x multiple courses though never produces mucus Assoc with over HB  / can't recline a recliner pas 45 degrees s sense of strangling / choking/ sob x months. Some better with saba/ no change on incruse   No obvious day to day or daytime variability or assoc excess/ purulent sputum or mucus plugs or hemoptysis or cp or chest tightness, subjective wheeze or overt sinus  symptoms. No unusual exp hx or h/o childhood pna/ asthma or knowledge of premature birth.  Sleeping ok without nocturnal  or early am exacerbation  of respiratory  c/o's or need for noct saba. Also denies any obvious fluctuation of symptoms with weather or environmental changes or other aggravating or alleviating factors except as outlined above   Current Medications, Allergies, Complete Past Medical History, Past Surgical History, Family History, and Social History were reviewed in [employer] record.  ROS  The following are not active complaints unless bolded sore throat, dysphagia, dental problems, itching,  sneezing,  nasal congestion or excess/ purulent secretions, ear ache,   fever, chills, sweats, unintended wt loss, classically pleuritic or exertional cp,  orthopnea pnd or leg swelling, presyncope, palpitations, abdominal pain, anorexia, nausea, vomiting, diarrhea  or change in bowel or bladder habits, change in stools or urine, dysuria,hematuria,  rash, arthralgias, visual complaints, headache, numbness, weakness or ataxia or problems with walking or coordination,  change in mood/affect or memory.           Review of Systems     Objective:   Physical Exam amb obese wf nad  Wt Readings from Last 3 Encounters:  04/29/16 224 lb 9.6 oz (101.878 kg)    Vital signs reviewed  HEENT: nl dentition, turbinates, and oropharynx. Nl external ear canals without cough reflex   NECK :  without JVD/Nodes/TM/ nl carotid upstrokes bilaterally   LUNGS: no acc muscle use,  Nl contour chest which is clear to A and P bilaterally without cough on insp or exp maneuvers   CV:  RRR  no s3 or murmur or increase in P2, no edema   ABD:  soft and nontender with nl inspiratory excursion in the supine position. No bruits or organomegaly, bowel sounds nl  MS:  Nl gait/ ext warm without deformities, calf tenderness, cyanosis or clubbing No obvious joint restrictions   SKIN: warm and dry without lesions    NEURO:  alert, approp, nl sensorium with  no motor deficits  I personally reviewed images and agree with radiology impression as follows:  CTa Chest   05/13/15    Neg for pe/ no acute cardiopulm dz/ no emphysema    Assessment:

## 2016-04-29 NOTE — Assessment & Plan Note (Addendum)
Spirometry 04/29/2016  Mild restrictive changes only p am incruse   - FENO 04/29/2016  =   10   Symptoms are markedly disproportionate to objective findings and not clear this is a lung problem but pt does appear to have difficult airway management issues. DDX of  difficult airways management almost all start with A and  include Adherence, Ace Inhibitors, Acid Reflux, Active Sinus Disease, Alpha 1 Antitripsin deficiency, Anxiety masquerading as Airways dz,  ABPA,  Allergy(esp in young), Aspiration (esp in elderly), Adverse effects of meds,  Active smokers, A bunch of PE's (a small clot burden can't cause this syndrome unless there is already severe underlying pulm or vascular dz with poor reserve) plus two Bs  = Bronchiectasis and Beta blocker use..and one C= CHF  Adherence is always the initial "prime suspect" and is a multilayered concern that requires a "trust but verify" approach in every patient - starting with knowing how to use medications, especially inhalers, correctly, keeping up with refills and understanding the fundamental difference between maintenance and prns vs those medications only taken for a very short course and then stopped and not refilled.   ACEi at top of the usual list of suspects > try off then regroup  ? Acid (or non-acid) GERD > always difficult to exclude as up to 75% of pts in some series report no assoc GI/ Heartburn symptoms> rec max (24h)  acid suppression and diet restrictions/ reviewed and instructions given in writing.   ? Adverse effects of dpi's >no evidence of copd so ok to try off and just use saba prn. - 04/29/2016  After extensive coaching HFA effectiveness =    75%   ? Allergies/ asthma hard to exclude but very unlikely given NO so low so needs to keep saba handy at all times  ? Anxiety > dx of exclusion  ? BB effects > doubt since only on low dose coreg but this may be why she appeared LAMA responsive at some point so may flare off Incruse and if so    prefer in this setting: Bystolic, the most beta -1  selective Beta blocker available in sample form, with bisoprolol the most selective generic choice  on the market rather than resuming Incruse.  ? chf > nothing clinically to suggest since the "orthopnea:" is immediate strangling /choking sensation and more likely from ACEi.   Total time devoted to counseling  = 35/43m review case with pt/ discussion of options/alternatives/ personally creating written instructions  in presence of pt  then going over those specific  Instructions directly with the pt including how to use all of the meds but in particular covering each new medication in detail and the difference between the maintenance/automatic meds and the prns using an action plan format for the latter.

## 2016-04-30 ENCOUNTER — Encounter: Payer: Self-pay | Admitting: *Deleted

## 2016-04-30 NOTE — Assessment & Plan Note (Addendum)
04/29/2016  After extensive coaching HFA effectiveness =    75% > try just saba prn  - FENO 04/29/2016  =   10  - Spirometry 04/29/2016  Purely restrictive   So will  try just saba prn as not clear how much asthma she has at this point but if not well controlled could add back a low dose ics or ics/laba hfa (not dpi as may irritate her upper airway issues which hopefully will just prove to be gerd or acei related)

## 2016-04-30 NOTE — Assessment & Plan Note (Signed)
D/c acei 04/30/2016 due to unexplained sob/dry cough   In the best review of chronic cough to date ( NEJM 2016 375 [id number]) ,  ACEi are now felt to cause cough in up to  20% of pts which is a 4 fold increase from previous reports and does not include the variety of non-specific complaints we see in pulmonary clinic in pts on ACEi but previously attributed to another dx like  Copd/asthma and  include PNDS, throat and chest congestion, "bronchitis", unexplained dyspnea and noct "strangling" sensations, and hoarseness, but also  atypical /refractory GERD symptoms like dysphagia and "bad heartburn"   The only way I know  to prove this is not an "ACEi Case" is a trial off ACEi x a minimum of 6 weeks then regroup.   rec d/c losartan and low dose acei and just use valsartan 160 mg daily x 6 weeks then regroup with full pfts to sort out

## 2016-05-14 DIAGNOSIS — M62830 Muscle spasm of back: Secondary | ICD-10-CM | POA: Diagnosis not present

## 2016-05-14 DIAGNOSIS — M5136 Other intervertebral disc degeneration, lumbar region: Secondary | ICD-10-CM | POA: Diagnosis not present

## 2016-05-14 DIAGNOSIS — M5416 Radiculopathy, lumbar region: Secondary | ICD-10-CM | POA: Diagnosis not present

## 2016-05-14 DIAGNOSIS — M545 Low back pain: Secondary | ICD-10-CM | POA: Diagnosis not present

## 2016-06-27 ENCOUNTER — Telehealth: Payer: Self-pay | Admitting: Internal Medicine

## 2016-06-27 ENCOUNTER — Ambulatory Visit: Payer: Medicare Other | Admitting: Internal Medicine

## 2016-06-27 NOTE — Telephone Encounter (Signed)
LMTCB x 1 

## 2016-07-01 NOTE — Telephone Encounter (Signed)
Patient returned call.  I advised her of her PFT at [facility] time and date and she said this was good and will be there.  Gave her PFT patient instructions, advised to go to [facility] admitting and try to be there about 15 min early.  She states she does not need a call back unless there is anything further.

## 2016-07-01 NOTE — Telephone Encounter (Signed)
I have left a message with respiratory to try to schedule this PFT. Will await their call.

## 2016-07-01 NOTE — Telephone Encounter (Signed)
Baxter Flattery called from respiratory and is going to place patient on schedule for 2:00 on 09/25.  If this does not work, may call to reschedule.  CB is [phone].

## 2016-07-01 NOTE — Telephone Encounter (Signed)
lmtcb x1 for pt. 

## 2016-07-07 ENCOUNTER — Ambulatory Visit (INDEPENDENT_AMBULATORY_CARE_PROVIDER_SITE_OTHER): Payer: Medicare Other | Admitting: Internal Medicine

## 2016-07-07 ENCOUNTER — Other Ambulatory Visit (INDEPENDENT_AMBULATORY_CARE_PROVIDER_SITE_OTHER): Payer: Medicare Other

## 2016-07-07 ENCOUNTER — Encounter: Payer: Self-pay | Admitting: Internal Medicine

## 2016-07-07 ENCOUNTER — Ambulatory Visit (HOSPITAL_COMMUNITY)
Admission: RE | Admit: 2016-07-07 | Discharge: 2016-07-07 | Disposition: A | Discharge status: Home / Self Care | Payer: Medicare Other | Source: Ambulatory Visit | Attending: Internal Medicine | Admitting: Internal Medicine

## 2016-07-07 VITALS — BP 136/82 | HR 77 | Ht 65.0 in | Wt 230.0 lb

## 2016-07-07 DIAGNOSIS — I10 Essential (primary) hypertension: Secondary | ICD-10-CM

## 2016-07-07 DIAGNOSIS — R0600 Dyspnea, unspecified: Secondary | ICD-10-CM | POA: Diagnosis not present

## 2016-07-07 DIAGNOSIS — J4530 Mild persistent asthma, uncomplicated: Secondary | ICD-10-CM | POA: Insufficient documentation

## 2016-07-07 LAB — BASIC METABOLIC PANEL
BUN: 25 mg/dL — ABNORMAL HIGH (ref 6–23)
CALCIUM: 9.3 mg/dL (ref 8.4–10.5)
CHLORIDE: 103 meq/L (ref 96–112)
CO2: 30 meq/L (ref 19–32)
Creatinine, Ser: 1.15 mg/dL (ref 0.40–1.20)
GFR: 48.8 mL/min — ABNORMAL LOW (ref >60.00)
Glucose, Bld: 133 mg/dL — ABNORMAL HIGH (ref 70–99)
Potassium: 3.9 mEq/L (ref 3.5–5.1)
SODIUM: 142 meq/L (ref 135–145)

## 2016-07-07 LAB — PULMONARY FUNCTION TEST
DL/VA % PRED: 75 %
DL/VA: 3.72 ml/min/mmHg/L
DLCO UNC: 13.68 ml/min/mmHg
DLCO unc % pred: 53 %
FEF 25-75 POST: 0.9 L/s
FEF 25-75 Pre: 2.4 L/sec
FEF2575-%Change-Post: -62 %
FEF2575-%PRED-PRE: 142 %
FEF2575-%Pred-Post: 53 %
FEV1-%Change-Post: -18 %
FEV1-%PRED-POST: 66 %
FEV1-%Pred-Pre: 81 %
FEV1-Post: 1.44 L
FEV1-Pre: 1.78 L
FEV1FVC-%Change-Post: -12 %
FEV1FVC-%Pred-Pre: 111 %
FEV6-%CHANGE-POST: 3 %
FEV6-%PRED-PRE: 68 %
FEV6-%Pred-Post: 71 %
FEV6-POST: 1.98 L
FEV6-Pre: 1.9 L
FEV6FVC-%PRED-POST: 105 %
FEV6FVC-%PRED-PRE: 105 %
FVC-%Change-Post: -6 %
FVC-%Pred-Post: 67 %
FVC-%Pred-Pre: 72 %
FVC-Post: 1.98 L
FVC-Pre: 2.12 L
POST FEV6/FVC RATIO: 100 %
PRE FEV1/FVC RATIO: 84 %
Post FEV1/FVC ratio: 73 %
Pre FEV6/FVC Ratio: 100 %
RV % pred: 77 %
RV: 1.81 L
TLC % PRED: 80 %
TLC: 4.21 L

## 2016-07-07 LAB — CBC WITH DIFFERENTIAL/PLATELET
BASOS PCT: 0.2 % (ref 0.0–3.0)
Basophils Absolute: 0 10*3/uL (ref 0.0–0.1)
EOS ABS: 0.1 10*3/uL (ref 0.0–0.7)
EOS PCT: 0.5 % (ref 0.0–5.0)
HCT: 47.2 % — ABNORMAL HIGH (ref 36.0–46.0)
Hemoglobin: 16.3 g/dL — ABNORMAL HIGH (ref 12.0–15.0)
LYMPHS ABS: 1.8 10*3/uL (ref 0.7–4.0)
LYMPHS PCT: 18.3 % (ref 12.0–46.0)
MCHC: 34.5 g/dL (ref 30.0–36.0)
MCV: 89 fl (ref 78.0–100.0)
MONO ABS: 0.6 10*3/uL (ref 0.1–1.0)
Monocytes Relative: 6 % (ref 3.0–12.0)
NEUTROS PCT: 75 % (ref 43.0–77.0)
Neutro Abs: 7.5 10*3/uL (ref 1.4–7.7)
PLATELETS: 236 10*3/uL (ref 150.0–400.0)
RBC: 5.3 Mil/uL — ABNORMAL HIGH (ref 3.87–5.11)
RDW: 13.7 % (ref 11.5–15.5)
WBC: 10 10*3/uL (ref 4.0–10.5)

## 2016-07-07 LAB — BRAIN NATRIURETIC PEPTIDE: PRO B NATRI PEPTIDE: 47 pg/mL (ref 0.0–100.0)

## 2016-07-07 LAB — TSH: TSH: 1.01 u[IU]/mL (ref 0.35–4.50)

## 2016-07-07 MED ORDER — ALBUTEROL SULFATE (2.5 MG/3ML) 0.083% IN NEBU
2.5000 mg | INHALATION_SOLUTION | Freq: Once | RESPIRATORY_TRACT | Status: AC
  Administered 2016-07-07: 2.5 mg via RESPIRATORY_TRACT

## 2016-07-07 NOTE — Patient Instructions (Addendum)
See your GI doctor but in the meantime don't change you acid suppression meds  GERD (REFLUX)  is an extremely common cause of respiratory symptoms just like yours , many times with no obvious heartburn at all.    It can be treated with medication, but also with lifestyle changes including elevation of the head of your bed (ideally with 6 inch  bed blocks),  Smoking cessation, avoidance of late meals, excessive alcohol, and avoid fatty foods, chocolate, peppermint, colas, red wine, and acidic juices such as orange juice.  NO MINT OR MENTHOL PRODUCTS SO NO COUGH DROPS   USE SUGARLESS CANDY INSTEAD (Jolley ranchers or Stover's or Life Savers) or even ice chips will also do - the key is to swallow to prevent all throat clearing. NO OIL BASED VITAMINS - use powdered substitutes.   Please remember to go to the lab  department downstairs for your tests - we will call you with the results when they are available.      If you are satisfied with your treatment plan,  let your doctor know and he/she can either refill your medications or you can return here when your prescription runs out.     If in any way you are not 100% satisfied,  please tell us.  If 100% better, tell your friends!  Pulmonary follow up is as needed

## 2016-07-07 NOTE — Progress Notes (Signed)
Subjective:     Patient ID: Candice Allen, female   DOB: 1941-03-13,     MRN: [id number]   Brief patient profile:  42 yowf never smoker with onset of asthma in late 20s with allergy shots x 4 years and total resolution of "wheezing" and no need for any medication then around 2013 noted onset of doe when climbing new steps with dx of copd in Jan 2015 while admitted for cva so placed on inhalers but did not help and worse to point where sob just trying to recline back in recliner so self referred to pulmonary clinic so self referred to pulmonary clinic .04/29/16    History of Present Illness  04/29/2016 1st [facility] office visit/ Candice Allen  Unexplained doe on coreg and ACEi and incruse  Chief Complaint  Patient presents with  . Pulmonary Consult    Self referral for COPD. Pt states that she was dxed with COPD 2 yrs ago. She c/o non prod cough with DOE "walking not far".    indolent onset doe x mailbox and back but knees gives out first  Variably severe cough rx as bronchitis better p prednisone x multiple courses though never produces mucus Assoc with overt HB  / can't recline a recliner past 45 degrees s sense of strangling / choking/ sob x months. Some better with saba/ no change on incruse  rec Stop incruse and lisinopril and cozar  Valsartan 160 mg daily  Only use your albuterol  Continue prilosec Take 30-60 min before first meal of the day and add pepcid 20 mg at bedtime until you return    07/07/2016  f/u ov/Candice Allen re: unexplained doe/ nl pfts except erv baseline 145 at age 75  Chief Complaint  Patient presents with  . Follow-up    PFT's done today. Breathing is unchanged. Her cough has completely resolved.   cough resolved / no change mild doe that correlated with wt gain   No obvious day to day or daytime variability or assoc excess/ purulent sputum or mucus plugs or hemoptysis or cp or chest tightness, subjective wheeze or overt sinus  symptoms. No unusual exp hx or h/o  childhood pna/ asthma or knowledge of premature birth.  Sleeping ok without nocturnal  or early am exacerbation  of respiratory  c/o's or need for noct saba. Also denies any obvious fluctuation of symptoms with weather or environmental changes or other aggravating or alleviating factors except as outlined above   Current Medications, Allergies, Complete Past Medical History, Past Surgical History, Family History, and Social History were reviewed in [employer] record.  ROS  The following are not active complaints unless bolded sore throat, dysphagia, dental problems, itching, sneezing,  nasal congestion or excess/ purulent secretions, ear ache,   fever, chills, sweats, unintended wt loss, classically pleuritic or exertional cp,  orthopnea pnd or leg swelling, presyncope, palpitations, abdominal pain, anorexia, nausea, vomiting, diarrhea  or change in bowel or bladder habits, change in stools or urine, dysuria,hematuria,  rash, arthralgias, visual complaints, headache, numbness, weakness or ataxia or problems with walking or coordination,  change in mood/affect or memory.                 Objective:   Physical Exam    amb obese wf nad      Wt Readings from Last 3 Encounters:  07/07/16 230 lb (104.3 kg)  04/29/16 224 lb 9.6 oz (101.9 kg)    Vital signs reviewed - sats 97% RA on  arrival  HEENT: nl dentition, turbinates, and oropharynx. Nl external ear canals without cough reflex   NECK :  without JVD/Nodes/TM/ nl carotid upstrokes bilaterally   LUNGS: no acc muscle use,  Nl contour chest which is clear to A and P bilaterally without cough on insp or exp maneuvers   CV:  RRR  no s3 or murmur or increase in P2, no edema   ABD:  soft and nontender with nl inspiratory excursion in the supine position. No bruits or organomegaly, bowel sounds nl  MS:  Nl gait/ ext warm without deformities, calf tenderness, cyanosis or clubbing No obvious joint restrictions    SKIN: warm and dry without lesions    NEURO:  alert, approp, nl sensorium with  no motor deficits    I personally reviewed images and agree with radiology impression as follows:  CTa Chest   05/13/15    Neg for pe/ no acute cardiopulm dz/ no emphysema   Labs ordered/ reviewed:      Chemistry      Component Value Date/Time   NA 142 07/07/2016 1659   K 3.9 07/07/2016 1659   CL 103 07/07/2016 1659   CO2 30 07/07/2016 1659   BUN 25 (H) 07/07/2016 1659   CREATININE 1.15 07/07/2016 1659      Component Value Date/Time   CALCIUM 9.3 07/07/2016 1659        Lab Results  Component Value Date   WBC 10.0 07/07/2016   HGB 16.3 (H) 07/07/2016   HCT 47.2 (H) 07/07/2016   MCV 89.0 07/07/2016   PLT 236.0 07/07/2016         Lab Results  Component Value Date   TSH 1.01 07/07/2016     Lab Results  Component Value Date   PROBNP 47.0 07/07/2016          Assessment:

## 2016-07-08 NOTE — Progress Notes (Signed)
Spoke with pt and notified of results per Dr. Wert. Pt verbalized understanding and denied any questions. 

## 2016-07-08 NOTE — Assessment & Plan Note (Signed)
Spirometry 04/29/2016  Mild restrictive changes only p am incruse   - FENO 04/29/2016  =   10   - Trial off acei 04/29/2016 see hbp  - PFT's 07/07/2016 moderate restrictive changes  dlco 53/ corrects to 75%  with ERV 13% c/w obesity   I had an extended final summary discussion with the patient reviewing all relevant studies completed to date and  lasting 15 to 20 minutes of a 25 minute visit on the following issues:    Presently knees are giving out before breath and only finding on pfts is explained by body habitus so no further pulmonary w/u or f/u indicated

## 2016-07-08 NOTE — Assessment & Plan Note (Signed)
D/c acei 04/30/2016 due to unexplained sob/dry cough   Marked improvement in symptoms off acei and bp good control  Although even in retrospect it may not be clear the ACEi contributed to the pt's symptoms,  Pt improved off them and adding them back at this point or in the future would risk confusion in interpretation of non-specific respiratory symptoms to which this patient is prone  ie  Better not to muddy the waters here.   Follow up per Primary Care planned

## 2016-07-08 NOTE — Assessment & Plan Note (Signed)
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   These are the same pts (now being labeled as having "irritable larynx syndrome" by some cough centers) who not infrequently have a history of having failed to tolerate ace inhibitors,  dry powder inhalers or biphosphonates or report having atypical/extraesophageal reflux symptoms that don't respond to standard doses of PPI  and are easily confused as having aecopd or asthma flares by even experienced allergists/ pulmonologists (myself included).   Much better now off acei, on gerd rx with f/u GI planned.    Pulmonary f/u prn

## 2016-07-08 NOTE — Assessment & Plan Note (Addendum)
Complicate by hbp and restrictive changes on pfts 07/07/2016   Body mass index is 38.27 kg/m.  Lab Results  Component Value Date   TSH 1.01 07/07/2016     Contributing to gerd tendency/ doe/reviewed the need and the process to achieve and maintain neg calorie balance > defer f/u primary care including intermittently monitoring thyroid status

## 2016-07-25 DIAGNOSIS — M170 Bilateral primary osteoarthritis of knee: Secondary | ICD-10-CM | POA: Diagnosis not present

## 2016-07-31 DIAGNOSIS — E785 Hyperlipidemia, unspecified: Secondary | ICD-10-CM | POA: Diagnosis not present

## 2016-07-31 DIAGNOSIS — E1169 Type 2 diabetes mellitus with other specified complication: Secondary | ICD-10-CM | POA: Diagnosis not present

## 2016-07-31 DIAGNOSIS — E1122 Type 2 diabetes mellitus with diabetic chronic kidney disease: Secondary | ICD-10-CM | POA: Diagnosis not present

## 2016-08-19 DIAGNOSIS — E785 Hyperlipidemia, unspecified: Secondary | ICD-10-CM | POA: Diagnosis not present

## 2016-08-19 DIAGNOSIS — J449 Chronic obstructive pulmonary disease, unspecified: Secondary | ICD-10-CM | POA: Diagnosis not present

## 2016-08-19 DIAGNOSIS — E1169 Type 2 diabetes mellitus with other specified complication: Secondary | ICD-10-CM | POA: Diagnosis not present

## 2016-08-19 DIAGNOSIS — E1122 Type 2 diabetes mellitus with diabetic chronic kidney disease: Secondary | ICD-10-CM | POA: Diagnosis not present

## 2016-08-21 DIAGNOSIS — M170 Bilateral primary osteoarthritis of knee: Secondary | ICD-10-CM | POA: Diagnosis not present

## 2016-08-21 DIAGNOSIS — M1712 Unilateral primary osteoarthritis, left knee: Secondary | ICD-10-CM | POA: Diagnosis not present

## 2016-08-21 DIAGNOSIS — M25461 Effusion, right knee: Secondary | ICD-10-CM | POA: Diagnosis not present

## 2016-08-21 DIAGNOSIS — M1711 Unilateral primary osteoarthritis, right knee: Secondary | ICD-10-CM | POA: Diagnosis not present

## 2016-09-26 DIAGNOSIS — J209 Acute bronchitis, unspecified: Secondary | ICD-10-CM | POA: Diagnosis not present

## 2016-11-27 DIAGNOSIS — N3001 Acute cystitis with hematuria: Secondary | ICD-10-CM | POA: Diagnosis not present

## 2017-01-27 DIAGNOSIS — E1122 Type 2 diabetes mellitus with diabetic chronic kidney disease: Secondary | ICD-10-CM | POA: Diagnosis not present

## 2017-01-27 DIAGNOSIS — E1140 Type 2 diabetes mellitus with diabetic neuropathy, unspecified: Secondary | ICD-10-CM | POA: Diagnosis not present

## 2017-01-27 DIAGNOSIS — D229 Melanocytic nevi, unspecified: Secondary | ICD-10-CM | POA: Diagnosis not present

## 2017-01-27 DIAGNOSIS — Z139 Encounter for screening, unspecified: Secondary | ICD-10-CM | POA: Diagnosis not present

## 2017-01-27 DIAGNOSIS — Z0000 Encounter for general adult medical examination without abnormal findings: Secondary | ICD-10-CM | POA: Diagnosis not present

## 2017-01-27 DIAGNOSIS — I129 Hypertensive chronic kidney disease with stage 1 through stage 4 chronic kidney disease, or unspecified chronic kidney disease: Secondary | ICD-10-CM | POA: Diagnosis not present

## 2017-01-27 DIAGNOSIS — Z1389 Encounter for screening for other disorder: Secondary | ICD-10-CM | POA: Diagnosis not present

## 2017-01-28 DIAGNOSIS — L820 Inflamed seborrheic keratosis: Secondary | ICD-10-CM | POA: Diagnosis not present

## 2017-04-05 IMAGING — MR MR LUMBAR SPINE W/O CM
4 of 5 series · 17 of 48 positions shown · non-contrast
Comparison: [facility]ala lumbar radiographs 05/24/2014.

CLINICAL DATA: 75-year-old female with right greater than left
lumbar back pain and intermittent numbness in the upper thigh for 2
years. Fall 2 years ago. Initial encounter.

EXAM:
MRI LUMBAR SPINE WITHOUT CONTRAST
TECHNIQUE: Multiplanar, multisequence MR imaging of the lumbar spine was
performed. No intravenous contrast was administered.

[Series 6: T2 · sagittal · 4.0mm · 0.70mm/px · 6 of 15 slices shown (1 of 2)]
[im 1/15]
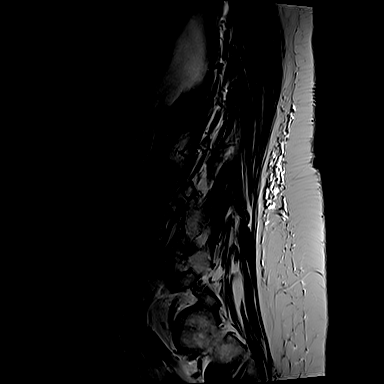
[im 3/15]
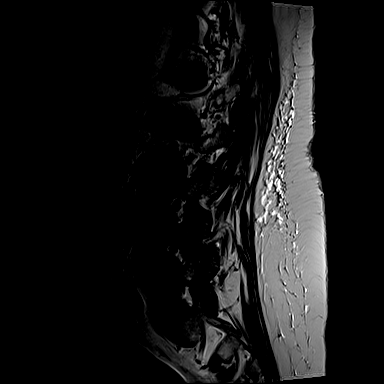
[im 6/15]
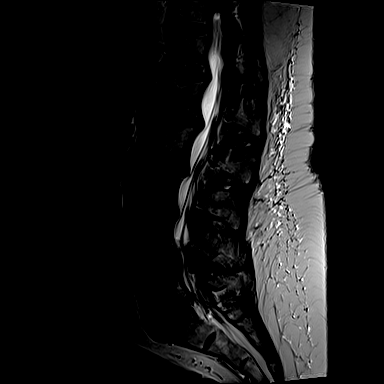
[im 9/15]
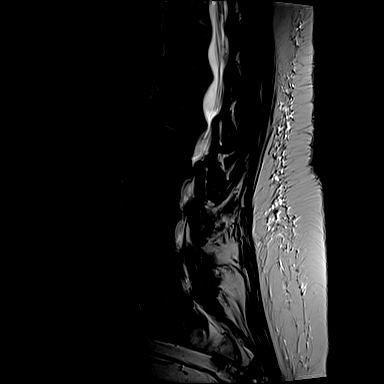
[im 12/15]
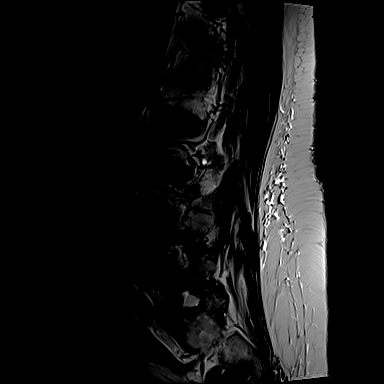
[im 15/15]
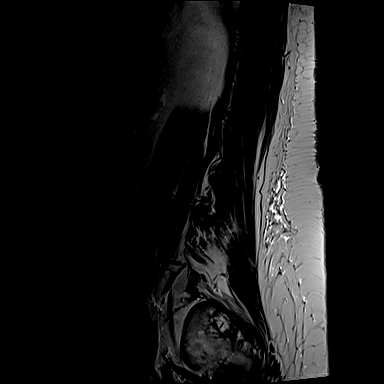

[Series 7: T1 · sagittal · 4.0mm · 0.73mm/px · 3 of 15 slices shown (1 of 2)]
[im 3/15]
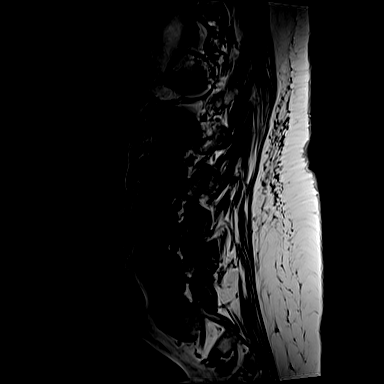
[im 9/15]
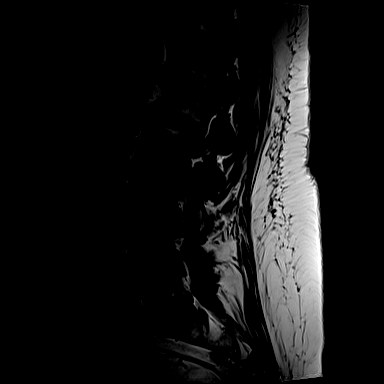
[im 15/15]
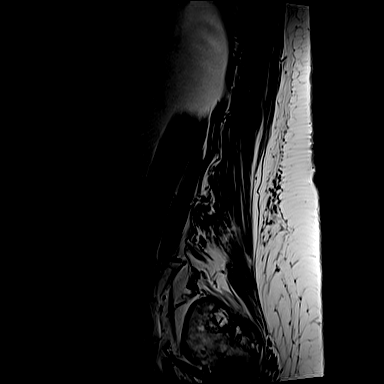

[Series 11: T1 · axial · 4.0mm · 0.28mm/px · z∈[-37,+121]mm · 3 of 35 slices shown (2 of 2)]
[im 5/35]
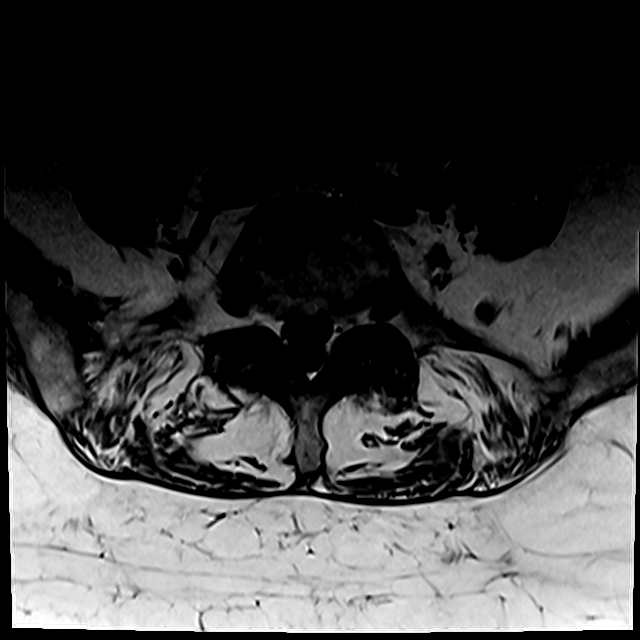
[im 18/35]
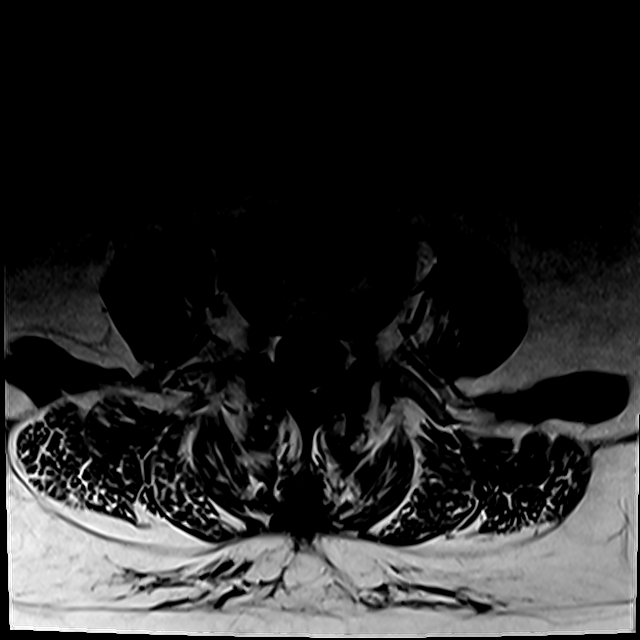
[im 30/35]
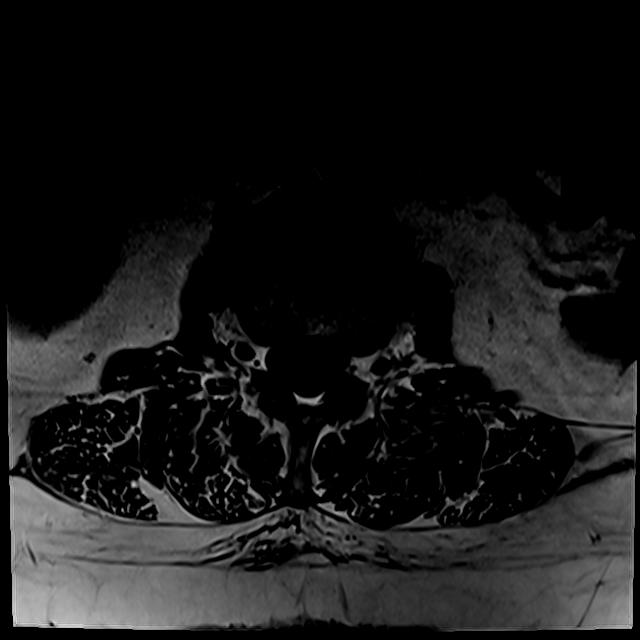

[Series 14: T2 · axial · 4.0mm · 0.28mm/px · z∈[-57,+121]mm · 5 of 35 slices shown (2 of 2)]
[im 1/35]
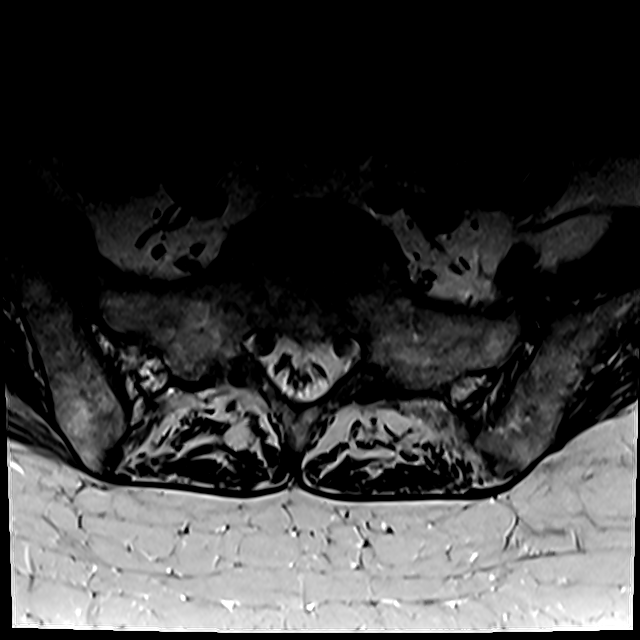
[im 5/35]
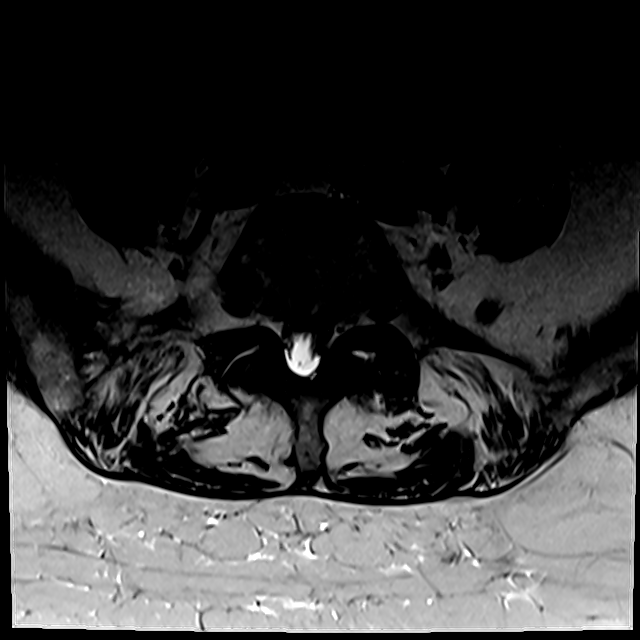
[im 10/35]
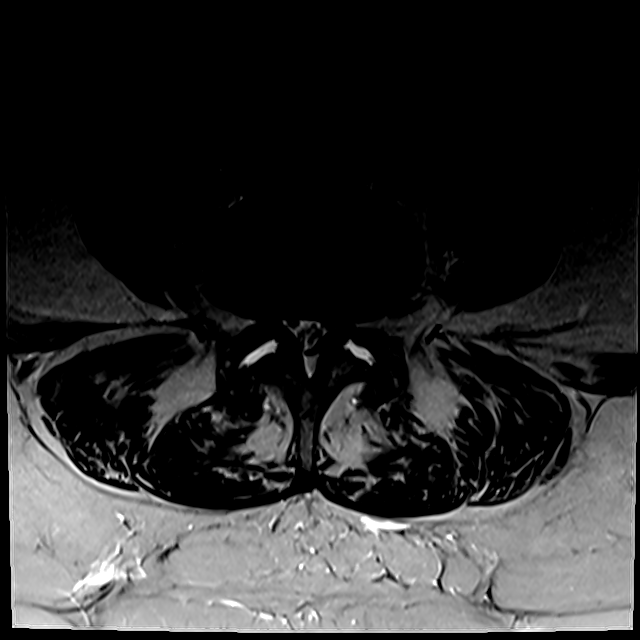
[im 18/35]
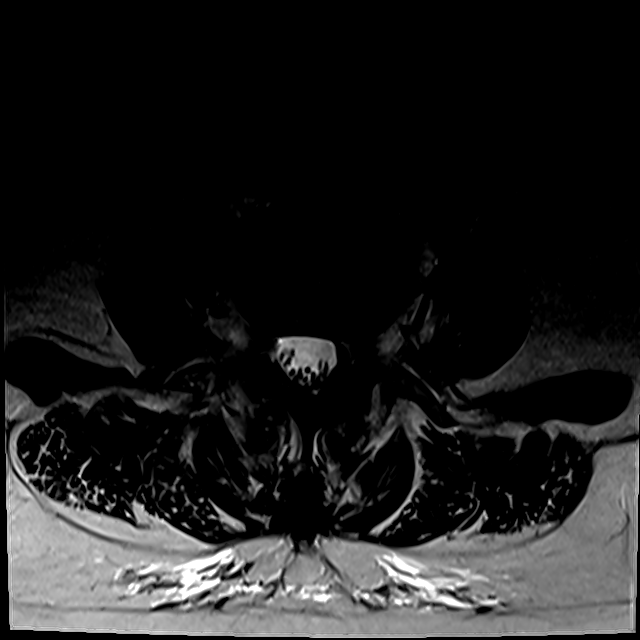
[im 30/35]
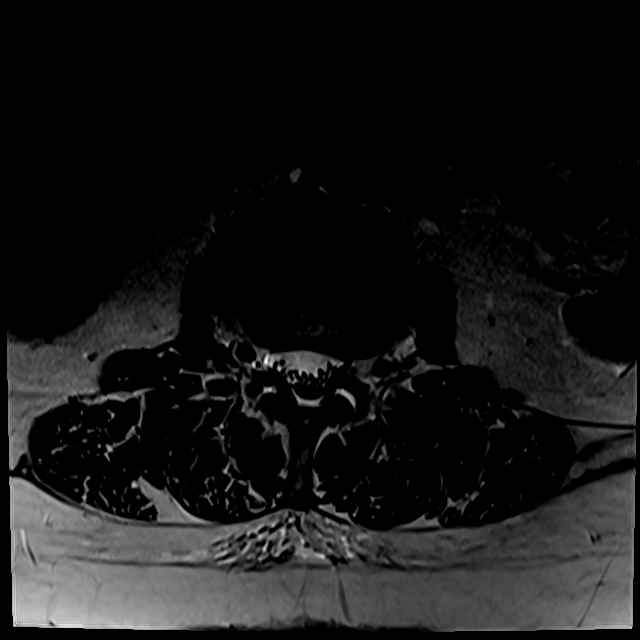

[17 of 48 positions shown; findings below may reference images not displayed]

FINDINGS: Segmentation: Normal as demonstrated on the comparison radiographs.
Hypoplastic ribs at T12.

Alignment: Trace anterolisthesis at L4-L5, and mild retrolisthesis
at L2-L3 are stable to mildly increased since 6026. Mild
retrolisthesis at L1-L2 does appear increased.

Vertebrae: There are chronic endplate degenerative changes from T12
inferiorly to L3 superiorly, compatible for the most part with
Schmorl nodes. Can day superior endplate Schmorl node at L2 has an
acute component with associated marrow edema (series 8, image 7). No
other marrow edema or acute osseous abnormality. Visible sacrum
intact.

Conus medullaris: Extends to the T12-L1 level and appears normal.

Paraspinal and other soft tissues: Negative.

Disc levels:

T11-T12: Mild to moderate facet hypertrophy.  No stenosis.

T12-L1: Disc space loss with circumferential disc bulge and endplate
Schmorl nodes. No stenosis.

L1-L2: Disc space loss with circumferential disc bulge and endplate
Schmorl nodes. Mild facet hypertrophy with trace facet joint fluid.
No significant stenosis.

L2-L3: Disc space loss with circumferential disc bulge and endplate
Schmorl nodes. Broad-based posterior component of disc. Mild to
moderate facet and mild ligament flavum hypertrophy. Epidural
lipomatosis. Mild to moderate spinal stenosis (series 14, image 14).
With mild to moderate left L2 foraminal stenosis. Furthermore, there
is a laterally positioned small degenerative synovial cyst on the
left (series 14, image 12) this is in proximity to but does not
appear to impinge the exiting left L2 nerve.

L3-L4: Circumferential disc bulge with mild broad-based posterior
component of disc. Mild to moderate facet and mild ligament flavum
hypertrophy. Trace facet joint fluid. Borderline to mild spinal and
L3 foraminal stenosis.

L4-L5: Circumferential disc bulge. Broad-based posterior component.
Severe facet and ligament flavum hypertrophy. Bilateral facet joint
fluid. Moderate spinal and bilateral lateral recess stenosis (series
14, image 26). Mild mostly right side L4 foraminal stenosis.

L5-S1: Mild left eccentric disc bulge. There is a small right
lateral recess annular fissure (series 14, image 32). There is
moderate to severe facet hypertrophy greater on the left. There is
epidural lipomatosis largely responsible for effacement of CSF from
the thecal sac. There is mild bilateral lateral recess stenosis. No
foraminal stenosis.
IMPRESSION: 1. Advanced chronic disc and endplate degeneration from T12-L1 to
L2-L3 with superimposed acute Schmorl node superiorly at L2.
Widespread chronic lumbar facet degeneration.
2. Associated mild spondylolisthesis at those levels and also at
L4-L5 where advanced disc and posterior element degeneration results
in moderate spinal and lateral recess stenosis.
3. Multifactorial mild to moderate spinal stenosis also at L2-L3
with moderate left foraminal stenosis.
4. Mild lateral recess stenosis at L5-S1.

## 2017-05-04 DIAGNOSIS — J9610 Chronic respiratory failure, unspecified whether with hypoxia or hypercapnia: Secondary | ICD-10-CM | POA: Diagnosis not present

## 2017-05-04 DIAGNOSIS — I25118 Atherosclerotic heart disease of native coronary artery with other forms of angina pectoris: Secondary | ICD-10-CM | POA: Diagnosis not present

## 2017-05-04 DIAGNOSIS — Z955 Presence of coronary angioplasty implant and graft: Secondary | ICD-10-CM | POA: Diagnosis not present

## 2017-05-04 DIAGNOSIS — I5021 Acute systolic (congestive) heart failure: Secondary | ICD-10-CM | POA: Diagnosis not present

## 2017-05-04 DIAGNOSIS — J9690 Respiratory failure, unspecified, unspecified whether with hypoxia or hypercapnia: Secondary | ICD-10-CM | POA: Diagnosis not present

## 2017-05-04 DIAGNOSIS — I2109 ST elevation (STEMI) myocardial infarction involving other coronary artery of anterior wall: Secondary | ICD-10-CM | POA: Diagnosis not present

## 2017-05-04 DIAGNOSIS — I110 Hypertensive heart disease with heart failure: Secondary | ICD-10-CM | POA: Diagnosis not present

## 2017-05-04 DIAGNOSIS — I2102 ST elevation (STEMI) myocardial infarction involving left anterior descending coronary artery: Secondary | ICD-10-CM | POA: Diagnosis not present

## 2017-05-04 DIAGNOSIS — Z7984 Long term (current) use of oral hypoglycemic drugs: Secondary | ICD-10-CM | POA: Diagnosis not present

## 2017-05-04 DIAGNOSIS — K7200 Acute and subacute hepatic failure without coma: Secondary | ICD-10-CM | POA: Diagnosis not present

## 2017-05-04 DIAGNOSIS — J9602 Acute respiratory failure with hypercapnia: Secondary | ICD-10-CM | POA: Diagnosis not present

## 2017-05-04 DIAGNOSIS — D649 Anemia, unspecified: Secondary | ICD-10-CM | POA: Diagnosis not present

## 2017-05-04 DIAGNOSIS — E119 Type 2 diabetes mellitus without complications: Secondary | ICD-10-CM | POA: Diagnosis not present

## 2017-05-04 DIAGNOSIS — E872 Acidosis: Secondary | ICD-10-CM | POA: Diagnosis not present

## 2017-05-04 DIAGNOSIS — J449 Chronic obstructive pulmonary disease, unspecified: Secondary | ICD-10-CM | POA: Diagnosis not present

## 2017-05-04 DIAGNOSIS — J9600 Acute respiratory failure, unspecified whether with hypoxia or hypercapnia: Secondary | ICD-10-CM | POA: Diagnosis not present

## 2017-05-04 DIAGNOSIS — E878 Other disorders of electrolyte and fluid balance, not elsewhere classified: Secondary | ICD-10-CM | POA: Diagnosis not present

## 2017-05-04 DIAGNOSIS — I454 Nonspecific intraventricular block: Secondary | ICD-10-CM | POA: Diagnosis not present

## 2017-05-04 DIAGNOSIS — I25119 Atherosclerotic heart disease of native coronary artery with unspecified angina pectoris: Secondary | ICD-10-CM | POA: Diagnosis not present

## 2017-05-04 DIAGNOSIS — I951 Orthostatic hypotension: Secondary | ICD-10-CM | POA: Diagnosis not present

## 2017-05-04 DIAGNOSIS — I4891 Unspecified atrial fibrillation: Secondary | ICD-10-CM | POA: Diagnosis not present

## 2017-05-04 DIAGNOSIS — I2119 ST elevation (STEMI) myocardial infarction involving other coronary artery of inferior wall: Secondary | ICD-10-CM | POA: Diagnosis not present

## 2017-05-04 DIAGNOSIS — I517 Cardiomegaly: Secondary | ICD-10-CM | POA: Diagnosis not present

## 2017-05-04 DIAGNOSIS — J9601 Acute respiratory failure with hypoxia: Secondary | ICD-10-CM | POA: Diagnosis not present

## 2017-05-04 DIAGNOSIS — Z780 Asymptomatic menopausal state: Secondary | ICD-10-CM | POA: Diagnosis not present

## 2017-05-04 DIAGNOSIS — R570 Cardiogenic shock: Secondary | ICD-10-CM | POA: Diagnosis not present

## 2017-05-04 DIAGNOSIS — R079 Chest pain, unspecified: Secondary | ICD-10-CM | POA: Diagnosis not present

## 2017-05-04 DIAGNOSIS — R112 Nausea with vomiting, unspecified: Secondary | ICD-10-CM | POA: Diagnosis not present

## 2017-05-04 DIAGNOSIS — I2582 Chronic total occlusion of coronary artery: Secondary | ICD-10-CM | POA: Diagnosis not present

## 2017-05-04 DIAGNOSIS — I340 Nonrheumatic mitral (valve) insufficiency: Secondary | ICD-10-CM | POA: Diagnosis not present

## 2017-05-04 DIAGNOSIS — J90 Pleural effusion, not elsewhere classified: Secondary | ICD-10-CM | POA: Diagnosis not present

## 2017-05-04 DIAGNOSIS — I480 Paroxysmal atrial fibrillation: Secondary | ICD-10-CM | POA: Diagnosis not present

## 2017-05-04 DIAGNOSIS — R0602 Shortness of breath: Secondary | ICD-10-CM | POA: Diagnosis not present

## 2017-05-04 DIAGNOSIS — E870 Hyperosmolality and hypernatremia: Secondary | ICD-10-CM | POA: Diagnosis not present

## 2017-05-04 DIAGNOSIS — K5730 Diverticulosis of large intestine without perforation or abscess without bleeding: Secondary | ICD-10-CM | POA: Diagnosis not present

## 2017-05-04 DIAGNOSIS — R197 Diarrhea, unspecified: Secondary | ICD-10-CM | POA: Diagnosis not present

## 2017-05-04 DIAGNOSIS — I493 Ventricular premature depolarization: Secondary | ICD-10-CM | POA: Diagnosis not present

## 2017-05-04 DIAGNOSIS — G9340 Encephalopathy, unspecified: Secondary | ICD-10-CM | POA: Diagnosis not present

## 2017-05-04 DIAGNOSIS — N170 Acute kidney failure with tubular necrosis: Secondary | ICD-10-CM | POA: Diagnosis not present

## 2017-05-04 DIAGNOSIS — I462 Cardiac arrest due to underlying cardiac condition: Secondary | ICD-10-CM | POA: Diagnosis not present

## 2017-05-04 DIAGNOSIS — I501 Left ventricular failure: Secondary | ICD-10-CM | POA: Diagnosis not present

## 2017-05-04 DIAGNOSIS — R000 Tachycardia, unspecified: Secondary | ICD-10-CM | POA: Diagnosis not present

## 2017-05-04 DIAGNOSIS — J9811 Atelectasis: Secondary | ICD-10-CM | POA: Diagnosis not present

## 2017-05-04 DIAGNOSIS — R9431 Abnormal electrocardiogram [ECG] [EKG]: Secondary | ICD-10-CM | POA: Diagnosis not present

## 2017-06-13 DEATH — deceased
